# Patient Record
Sex: Female | Born: 1980 | Race: Black or African American | Hispanic: No | Marital: Single | Smoking: Former smoker
Health system: Southern US, Community
[De-identification: ages and names within clinical notes are randomized; demographics above are authoritative.]

## PROBLEM LIST (undated history)

## (undated) DIAGNOSIS — B009 Herpesviral infection, unspecified: Secondary | ICD-10-CM

## (undated) DIAGNOSIS — E119 Type 2 diabetes mellitus without complications: Secondary | ICD-10-CM

## (undated) HISTORY — PX: ABDOMINAL HYSTERECTOMY: SHX81

## (undated) HISTORY — DX: Herpesviral infection, unspecified: B00.9

## (undated) HISTORY — PX: TERATOMA EXCISION: SHX2491

---

## 2008-10-24 ENCOUNTER — Emergency Department (HOSPITAL_COMMUNITY): Admission: EM | Admit: 2008-10-24 | Discharge: 2008-10-24 | Payer: Self-pay | Admitting: Emergency Medicine

## 2012-04-16 ENCOUNTER — Ambulatory Visit: Payer: BC Managed Care – PPO

## 2012-04-16 ENCOUNTER — Emergency Department (HOSPITAL_COMMUNITY): Payer: BC Managed Care – PPO

## 2012-04-16 ENCOUNTER — Emergency Department (HOSPITAL_COMMUNITY)
Admission: EM | Admit: 2012-04-16 | Discharge: 2012-04-16 | Disposition: A | Discharge status: Home / Self Care | Payer: BC Managed Care – PPO | Attending: Emergency Medicine | Admitting: Emergency Medicine

## 2012-04-16 ENCOUNTER — Encounter (HOSPITAL_COMMUNITY): Payer: Self-pay

## 2012-04-16 DIAGNOSIS — J189 Pneumonia, unspecified organism: Secondary | ICD-10-CM | POA: Insufficient documentation

## 2012-04-16 DIAGNOSIS — F17200 Nicotine dependence, unspecified, uncomplicated: Secondary | ICD-10-CM | POA: Insufficient documentation

## 2012-04-16 MED ORDER — ACETAMINOPHEN 325 MG PO TABS
650.0000 mg | ORAL_TABLET | Freq: Once | ORAL | Status: AC
  Administered 2012-04-16: 650 mg via ORAL
  Filled 2012-04-16: qty 2

## 2012-04-16 MED ORDER — FENTANYL CITRATE 0.05 MG/ML IJ SOLN
50.0000 ug | Freq: Once | INTRAMUSCULAR | Status: AC
  Administered 2012-04-16: 50 ug via INTRAVENOUS
  Filled 2012-04-16: qty 2

## 2012-04-16 MED ORDER — OXYCODONE-ACETAMINOPHEN 5-325 MG PO TABS
1.0000 | ORAL_TABLET | Freq: Once | ORAL | Status: DC
  Filled 2012-04-16: qty 1

## 2012-04-16 MED ORDER — SODIUM CHLORIDE 0.9 % IV BOLUS (SEPSIS)
1000.0000 mL | Freq: Once | INTRAVENOUS | Status: AC
  Administered 2012-04-16: 1000 mL via INTRAVENOUS

## 2012-04-16 MED ORDER — ALBUTEROL SULFATE (5 MG/ML) 0.5% IN NEBU
5.0000 mg | INHALATION_SOLUTION | Freq: Once | RESPIRATORY_TRACT | Status: AC
  Administered 2012-04-16: 5 mg via RESPIRATORY_TRACT
  Filled 2012-04-16: qty 1

## 2012-04-16 MED ORDER — HYDROCODONE-ACETAMINOPHEN 5-500 MG PO TABS: 1.0000 | ORAL_TABLET | Freq: Four times a day (QID) | ORAL | Status: AC | PRN

## 2012-04-16 MED ORDER — AZITHROMYCIN 250 MG PO TABS
1000.0000 mg | ORAL_TABLET | Freq: Once | ORAL | Status: AC
  Administered 2012-04-16: 1000 mg via ORAL
  Filled 2012-04-16: qty 4

## 2012-04-16 MED ORDER — AZITHROMYCIN 250 MG PO TABS: ORAL_TABLET | ORAL | Status: AC

## 2012-04-16 MED ORDER — ALBUTEROL SULFATE HFA 108 (90 BASE) MCG/ACT IN AERS: INHALATION_SPRAY | RESPIRATORY_TRACT | Status: DC

## 2012-04-16 MED ORDER — ONDANSETRON HCL 4 MG/2ML IJ SOLN
4.0000 mg | Freq: Once | INTRAMUSCULAR | Status: AC
  Administered 2012-04-16: 4 mg via INTRAVENOUS
  Filled 2012-04-16: qty 2

## 2012-04-16 NOTE — ED Notes (Signed)
Pt stated no relief from pain.

## 2012-04-16 NOTE — ED Notes (Signed)
gen body aches, joint pain, fevers, cough x 2 days.  States she can't get a good cough b/c it hurts her chest to cough.

## 2012-04-16 NOTE — Discharge Instructions (Signed)
Take antibiotic as directed for pneumonia. Use hydrocodone-acetaminophen as needed for pain but do not drive or operate machinery with use. Alternate with ibuprofen for additional relief. Use albuterol inhaler as needed for cough. Follow up with a primary care provider as needed in the next 3-5 days for recheck of ongoing symptoms but return to ER at any time for changing or worsening of symptoms.   Pneumonia, Adult Pneumonia is an infection of the lungs.  CAUSES Pneumonia may be caused by bacteria or a virus. Usually, these infections are caused by breathing infectious particles into the lungs (respiratory tract). SYMPTOMS   Cough.   Fever.   Chest pain.   Increased rate of breathing.   Wheezing.   Mucus production.  DIAGNOSIS  If you have the common symptoms of pneumonia, your caregiver will typically confirm the diagnosis with a chest X-ray. The X-ray will show an abnormality in the lung (pulmonary infiltrate) if you have pneumonia. Other tests of your blood, urine, or sputum may be done to find the specific cause of your pneumonia. Your caregiver may also do tests (blood gases or pulse oximetry) to see how well your lungs are working. TREATMENT  Some forms of pneumonia may be spread to other people when you cough or sneeze. You may be asked to wear a mask before and during your exam. Pneumonia that is caused by bacteria is treated with antibiotic medicine. Pneumonia that is caused by the influenza virus may be treated with an antiviral medicine. Most other viral infections must run their course. These infections will not respond to antibiotics.  PREVENTION A pneumococcal shot (vaccine) is available to prevent a common bacterial cause of pneumonia. This is usually suggested for:  People over 53 years old.   Patients on chemotherapy.   People with chronic lung problems, such as bronchitis or emphysema.   People with immune system problems.  If you are over 65 or have a high risk  condition, you may receive the pneumococcal vaccine if you have not received it before. In some countries, a routine influenza vaccine is also recommended. This vaccine can help prevent some cases of pneumonia.You may be offered the influenza vaccine as part of your care. If you smoke, it is time to quit. You may receive instructions on how to stop smoking. Your caregiver can provide medicines and counseling to help you quit. HOME CARE INSTRUCTIONS   Cough suppressants may be used if you are losing too much rest. However, coughing protects you by clearing your lungs. You should avoid using cough suppressants if you can.   Your caregiver may have prescribed medicine if he or she thinks your pneumonia is caused by a bacteria or influenza. Finish your medicine even if you start to feel better.   Your caregiver may also prescribe an expectorant. This loosens the mucus to be coughed up.   Only take over-the-counter or prescription medicines for pain, discomfort, or fever as directed by your caregiver.   Do not smoke. Smoking is a common cause of bronchitis and can contribute to pneumonia. If you are a smoker and continue to smoke, your cough may last several weeks after your pneumonia has cleared.   A cold steam vaporizer or humidifier in your room or home may help loosen mucus.   Coughing is often worse at night. Sleeping in a semi-upright position in a recliner or using a couple pillows under your head will help with this.   Get rest as you feel it is needed.  Your body will usually let you know when you need to rest.  SEEK IMMEDIATE MEDICAL CARE IF:   Your illness becomes worse. This is especially true if you are elderly or weakened from any other disease.   You cannot control your cough with suppressants and are losing sleep.   You begin coughing up blood.   You develop pain which is getting worse or is uncontrolled with medicines.   You have a fever.   Any of the symptoms which  initially brought you in for treatment are getting worse rather than better.   You develop shortness of breath or chest pain.  MAKE SURE YOU:   Understand these instructions.   Will watch your condition.   Will get help right away if you are not doing well or get worse.  Document Released: 10/09/2005 Document Revised: 09/28/2011 Document Reviewed: 12/29/2010 [facility] Patient Information 2012 [place], [place].

## 2012-04-16 NOTE — ED Provider Notes (Signed)
History     CSN: [id number]  Arrival date & time 04/16/12  1327   First MD Initiated Contact with Patient 04/16/12 1513      Chief Complaint  Patient presents with  . Generalized Body Aches  . Fever  . Cough    (Consider location/radiation/quality/duration/timing/severity/associated sxs/prior treatment) HPI  Patient states she has no known medical problems and takes no medicines on regular basis presents to emergency department complaining of a 2 day history of general body aches, joint aches, fevers, and unproductive cough. Patient states that when she coughs she feels tightness and pain in her chest but states "I can't seem to cough anything up." Patient states that she has been taking ibuprofen without relief of symptoms however temporary improvement in fevers. She denies hemoptysis, abdominal pain, nausea, vomiting, diarrhea, dysuria, hematuria, or blood in her stool. Patient states she has no allergies to medications. Patient states she's followed by her primary care physician for general health care management, Dr. Blondell Reveal.   History reviewed. No pertinent past medical history.  Past Surgical History  Procedure Date  . Teratoma excision   . Cesarean section     x3  . Abdominal hysterectomy     History reviewed. No pertinent family history.  History  Substance Use Topics  . Smoking status: Current Everyday Smoker    Types: Cigarettes  . Smokeless tobacco: Not on file   Comment: took chantix so smokes 1-2 cigs/day  . Alcohol Use: Yes     occasionally    OB History    Grav Para Term Preterm Abortions TAB SAB Ect Mult Living                  Review of Systems  All other systems reviewed and are negative.    Allergies  Review of patient's allergies indicates not on file.  Home Medications  No current outpatient prescriptions on file.  BP 104/58  Pulse 104  Temp 98.1 F (36.7 C) (Oral)  Resp 18  Ht 5\' 8"  (1.727 m)  Wt 250 lb (113.399 kg)  BMI 38.01  kg/m2  SpO2 100%  Physical Exam  Nursing note and vitals reviewed. Constitutional: She is oriented to person, place, and time. She appears well-developed and well-nourished. No distress.  HENT:  Head: Normocephalic and atraumatic.  Eyes: Conjunctivae are normal.  Neck: Normal range of motion. Neck supple.  Cardiovascular: Regular rhythm, normal heart sounds and intact distal pulses.  Tachycardia present.  Exam reveals no gallop and no friction rub.   No murmur heard. Pulmonary/Chest: Effort normal and breath sounds normal. No respiratory distress. She has no wheezes. She has no rales. She exhibits no tenderness.  Abdominal: Soft. Bowel sounds are normal. She exhibits no distension and no mass. There is no tenderness. There is no rebound and no guarding.  Musculoskeletal: Normal range of motion. She exhibits no edema and no tenderness.  Neurological: She is alert and oriented to person, place, and time.  Skin: Skin is warm and dry. No rash noted. She is not diaphoretic. No erythema.  Psychiatric: She has a normal mood and affect.    ED Course  Procedures (including critical care time)  Labs Reviewed - No data to display Dg Chest 2 View  04/16/2012  *RADIOLOGY REPORT*  Clinical Data: Left-sided chest pain and body aches.  Fever and cough.  CHEST - 2 VIEW  Comparison: None.  Findings: The heart size is normal.  Left lingular airspace disease is concerning for pneumonia.  The  right lung is clear.  The visualized soft tissues and bony thorax are unremarkable.  IMPRESSION:  1.  Lingular pneumonia.  Original Report Authenticated By: Jamesetta Orleans. MATTERN, M.D.     1. CAP (community acquired pneumonia)       MDM  Patient with community-acquired pneumonia however she has no comorbidities and is appropriate for outpatient management of her CAP given that she is in no stress with improving vital signs improving symptoms throughout ER stay. I spoke at length with patient about changing or  worsening symptoms that should prompt immediate return to emergency department versus following up with her primary care physician. Patient voices her understanding and is agreeable plan to        Drucie Opitz, [place] 04/16/12 1933

## 2012-04-17 NOTE — ED Provider Notes (Signed)
Medical screening examination/treatment/procedure(s) were performed by non-physician practitioner and as supervising physician I was immediately available for consultation/collaboration.  Doug Sou, MD 04/17/12 [phone]

## 2012-12-12 ENCOUNTER — Encounter (HOSPITAL_BASED_OUTPATIENT_CLINIC_OR_DEPARTMENT_OTHER): Payer: Self-pay | Admitting: Emergency Medicine

## 2012-12-12 ENCOUNTER — Emergency Department (HOSPITAL_BASED_OUTPATIENT_CLINIC_OR_DEPARTMENT_OTHER)
Admission: EM | Admit: 2012-12-12 | Discharge: 2012-12-12 | Disposition: A | Discharge status: Home / Self Care | Payer: Medicaid Other | Attending: Emergency Medicine | Admitting: Emergency Medicine

## 2012-12-12 DIAGNOSIS — K089 Disorder of teeth and supporting structures, unspecified: Secondary | ICD-10-CM | POA: Insufficient documentation

## 2012-12-12 DIAGNOSIS — J3489 Other specified disorders of nose and nasal sinuses: Secondary | ICD-10-CM | POA: Insufficient documentation

## 2012-12-12 DIAGNOSIS — F17200 Nicotine dependence, unspecified, uncomplicated: Secondary | ICD-10-CM | POA: Insufficient documentation

## 2012-12-12 DIAGNOSIS — K0889 Other specified disorders of teeth and supporting structures: Secondary | ICD-10-CM

## 2012-12-12 DIAGNOSIS — R220 Localized swelling, mass and lump, head: Secondary | ICD-10-CM | POA: Insufficient documentation

## 2012-12-12 MED ORDER — PENICILLIN V POTASSIUM 250 MG PO TABS: 250.0000 mg | ORAL_TABLET | Freq: Four times a day (QID) | ORAL | Status: AC

## 2012-12-12 MED ORDER — OXYCODONE-ACETAMINOPHEN 5-325 MG PO TABS: 2.0000 | ORAL_TABLET | ORAL | Status: DC | PRN

## 2012-12-12 NOTE — ED Provider Notes (Signed)
History  This chart was scribed for Loren Racer, MD by Bennett Scrape, ED Scribe. This patient was seen in room MH05/MH05 and the patient's care was started at 6:32 PM.  CSN: [id number]  Arrival date & time 12/12/12  4098   First MD Initiated Contact with Patient 12/12/12 1832      Chief Complaint  Patient presents with  . Dental Pain    Patient is a 32 y.o. female presenting with tooth pain. The history is provided by the patient. No language interpreter was used.  Dental PainPrimary symptoms do not include fever. The symptoms began 2 days ago. The symptoms are worsening. The symptoms are recurrent. The symptoms occur constantly.  Additional symptoms include: gum swelling and gum tenderness. Additional symptoms do not include: facial swelling, trouble swallowing and ear pain. Medical issues do not include: alcohol problem.    Julie Coffey is a 32 y.o. female who presents to the Emergency Department complaining of 2 days of left upper dental pain with associated left sinus pressure. She reports that she has been following up with a dentist and has a root canal scheduled on March 6th, 2014. She states that she was taking ibuprofen and clove oil with moderate improvement. She denies trouble swallowing, fevers, chills and facial swelling as associated symptoms. She does not have a h/o chronic medical conditions and is a current everyday smoker but denies alcohol use.   History reviewed. No pertinent past medical history.  Past Surgical History  Procedure Laterality Date  . Teratoma excision    . Cesarean section      x3  . Abdominal hysterectomy      No family history on file.  History  Substance Use Topics  . Smoking status: Current Every Day Smoker    Types: Cigarettes  . Smokeless tobacco: Not on file     Comment: took chantix so smokes 1-2 cigs/day  . Alcohol Use: Yes     Comment: occasionally    No OB history provided.  Review of Systems  Constitutional:  Negative for fever and chills.  HENT: Positive for dental problem and sinus pressure. Negative for ear pain, facial swelling and trouble swallowing.   All other systems reviewed and are negative.    Allergies  Review of patient's allergies indicates no known allergies.  Home Medications   Current Outpatient Rx  Name  Route  Sig  Dispense  Refill  . albuterol (PROVENTIL HFA;VENTOLIN HFA) 108 (90 BASE) MCG/ACT inhaler      Inhale 1-2 puffs into the lungs every 6 (six) hours as needed for coughing.   1 Inhaler   0     Triage Vitals: BP 140/88  Pulse 84  Temp(Src) 98.5 F (36.9 C) (Oral)  Resp 16  Ht 5\' 8"  (1.727 m)  Wt 235 lb (106.595 kg)  BMI 35.74 kg/m2  SpO2 99%  Physical Exam  Nursing note and vitals reviewed. Constitutional: She is oriented to person, place, and time. She appears well-developed and well-nourished. No distress.  HENT:  Head: Normocephalic and atraumatic.  Left lateral incisor has a large cary, no obvious gingival swelling, poor dentition in general  Eyes: EOM are normal.  Neck: Neck supple. No tracheal deviation present.  Cardiovascular: Normal rate.   Pulmonary/Chest: Effort normal. No respiratory distress.  Musculoskeletal: Normal range of motion.  Lymphadenopathy:    She has no cervical adenopathy.  Neurological: She is alert and oriented to person, place, and time.  Skin: Skin is warm and dry.  Psychiatric:  She has a normal mood and affect. Her behavior is normal.    ED Course  Procedures (including critical care time)  DIAGNOSTIC STUDIES: Oxygen Saturation is 99% on room air, normal by my interpretation.    COORDINATION OF CARE: 6:40 PM- Advised pt that i cannot give her narcotics in the ED before discharge due to her driving. Discussed discharge plan which includes antibiotics and referral to a dentist with pt and pt agreed to plan.    Labs Reviewed - No data to display No results found.   1. Pain, dental       MDM  I  personally performed the services described in this documentation, which was scribed in my presence. The recorded information has been reviewed and is accurate.    Loren Racer, MD 12/12/12 2259

## 2012-12-12 NOTE — ED Notes (Signed)
C/o toothache and sinus pressure since 12/10/12.  Has taken IBU and Aleve without relief.  Has also tried Clove Oil with mild relief.

## 2014-01-09 ENCOUNTER — Emergency Department (HOSPITAL_BASED_OUTPATIENT_CLINIC_OR_DEPARTMENT_OTHER)
Admission: EM | Admit: 2014-01-09 | Discharge: 2014-01-09 | Disposition: A | Discharge status: Home / Self Care | Payer: BC Managed Care – PPO | Attending: Emergency Medicine | Admitting: Emergency Medicine

## 2014-01-09 ENCOUNTER — Encounter (HOSPITAL_BASED_OUTPATIENT_CLINIC_OR_DEPARTMENT_OTHER): Payer: Self-pay | Admitting: Emergency Medicine

## 2014-01-09 DIAGNOSIS — K0889 Other specified disorders of teeth and supporting structures: Secondary | ICD-10-CM

## 2014-01-09 DIAGNOSIS — Z792 Long term (current) use of antibiotics: Secondary | ICD-10-CM | POA: Insufficient documentation

## 2014-01-09 DIAGNOSIS — K089 Disorder of teeth and supporting structures, unspecified: Secondary | ICD-10-CM | POA: Insufficient documentation

## 2014-01-09 DIAGNOSIS — F17200 Nicotine dependence, unspecified, uncomplicated: Secondary | ICD-10-CM | POA: Insufficient documentation

## 2014-01-09 MED ORDER — OXYCODONE-ACETAMINOPHEN 5-325 MG PO TABS: 1.0000 | ORAL_TABLET | ORAL | Status: DC | PRN

## 2014-01-09 MED ORDER — PENICILLIN V POTASSIUM 250 MG PO TABS: 250.0000 mg | ORAL_TABLET | Freq: Four times a day (QID) | ORAL | Status: AC

## 2014-01-09 NOTE — ED Notes (Signed)
Dental pain x 1 week worsening last night.

## 2014-01-09 NOTE — ED Notes (Signed)
PA at bedside.

## 2014-01-09 NOTE — Discharge Instructions (Signed)
Dental Pain °Toothache is pain in or around a tooth. It may get worse with chewing or with cold or heat.  °HOME CARE °· Your dentist may use a numbing medicine during treatment. If so, you may need to avoid eating until the medicine wears off. Ask your dentist about this. °· Only take medicine as told by your dentist or doctor. °· Avoid chewing food near the painful tooth until after all treatment is done. Ask your dentist about this. °GET HELP RIGHT AWAY IF:  °· The problem gets worse or new problems appear. °· You have a fever. °· There is redness and puffiness (swelling) of the face, jaw, or neck. °· You cannot open your mouth. °· There is pain in the jaw. °· There is very bad pain that is not helped by medicine. °MAKE SURE YOU:  °· Understand these instructions. °· Will watch your condition. °· Will get help right away if you are not doing well or get worse. °Document Released: 03/27/2008 Document Revised: 01/01/2012 Document Reviewed: 03/27/2008 °[employer]® Patient Information ©2014 [employer]. ° °

## 2014-01-09 NOTE — ED Provider Notes (Signed)
CSN: [id number]     Arrival date & time 01/09/14  1840 History   First MD Initiated Contact with Patient 01/09/14 1912     Chief Complaint  Patient presents with  . Dental Pain     (Consider location/radiation/quality/duration/timing/severity/associated sxs/prior Treatment) HPI Comments: Pt c/o left lower dentition that started a week ago and worsened last night. Pt state that she has an appointment with the dentist in the next week. Pt denies fever, swelling or problems breathing  The history is provided by the patient. No language interpreter was used.    History reviewed. No pertinent past medical history. Past Surgical History  Procedure Laterality Date  . Teratoma excision    . Cesarean section      x3  . Abdominal hysterectomy     No family history on file. History  Substance Use Topics  . Smoking status: Current Every Day Smoker -- 0.50 packs/day    Types: Cigarettes  . Smokeless tobacco: Not on file     Comment: took chantix so smokes 1-2 cigs/day  . Alcohol Use: Yes     Comment: occasionally   OB History   Grav Para Term Preterm Abortions TAB SAB Ect Mult Living                 Review of Systems  Constitutional: Negative.   Respiratory: Negative.   Cardiovascular: Negative.       Allergies  Review of patient's allergies indicates no known allergies.  Home Medications   Current Outpatient Rx  Name  Route  Sig  Dispense  Refill  . albuterol (PROVENTIL HFA;VENTOLIN HFA) 108 (90 BASE) MCG/ACT inhaler      Inhale 1-2 puffs into the lungs every 6 (six) hours as needed for coughing.   1 Inhaler   0   . oxyCODONE-acetaminophen (PERCOCET) 5-325 MG per tablet   Oral   Take 2 tablets by mouth every 4 (four) hours as needed for pain.   10 tablet   0   . oxyCODONE-acetaminophen (PERCOCET/ROXICET) 5-325 MG per tablet   Oral   Take 1-2 tablets by mouth every 4 (four) hours as needed for severe pain.   10 tablet   0   . penicillin v potassium (VEETID)  250 MG tablet   Oral   Take 1 tablet (250 mg total) by mouth 4 (four) times daily.   40 tablet   0    BP 148/88  Pulse 95  Temp(Src) 98 F (36.7 C) (Oral)  Resp 20  Ht 5\' 8"  (1.727 m)  Wt 235 lb (106.595 kg)  BMI 35.74 kg/m2  SpO2 100% Physical Exam  Nursing note and vitals reviewed. Constitutional: She appears well-developed and well-nourished.  HENT:  Head: Normocephalic and atraumatic.  Right Ear: External ear normal.  Left Ear: External ear normal.  Mouth/Throat:    Eyes: Conjunctivae and EOM are normal.  Cardiovascular: Normal rate and regular rhythm.   Pulmonary/Chest: Effort normal and breath sounds normal.    ED Course  Procedures (including critical care time) Labs Review Labs Reviewed - No data to display Imaging Review No results found.   EKG Interpretation None      MDM   Final diagnoses:  Toothache    Pt to follow up with dentist next week;treated with oxycodone and pcn    Teressa LowerVrinda Loretha Ure, NP 01/09/14 1944

## 2014-01-09 NOTE — ED Provider Notes (Signed)
Medical screening examination/treatment/procedure(s) were performed by non-physician practitioner and as supervising physician I was immediately available for consultation/collaboration.   EKG Interpretation None        Raylie Maddison S Amani Nodarse, MD 01/09/14 2350 

## 2014-06-09 ENCOUNTER — Emergency Department (HOSPITAL_BASED_OUTPATIENT_CLINIC_OR_DEPARTMENT_OTHER)
Admission: EM | Admit: 2014-06-09 | Discharge: 2014-06-10 | Disposition: A | Discharge status: Home / Self Care | Payer: Managed Care, Other (non HMO) | Attending: Emergency Medicine | Admitting: Emergency Medicine

## 2014-06-09 ENCOUNTER — Encounter (HOSPITAL_BASED_OUTPATIENT_CLINIC_OR_DEPARTMENT_OTHER): Payer: Self-pay | Admitting: Emergency Medicine

## 2014-06-09 DIAGNOSIS — K089 Disorder of teeth and supporting structures, unspecified: Secondary | ICD-10-CM | POA: Insufficient documentation

## 2014-06-09 DIAGNOSIS — R220 Localized swelling, mass and lump, head: Secondary | ICD-10-CM | POA: Diagnosis not present

## 2014-06-09 DIAGNOSIS — K0889 Other specified disorders of teeth and supporting structures: Secondary | ICD-10-CM

## 2014-06-09 DIAGNOSIS — R221 Localized swelling, mass and lump, neck: Secondary | ICD-10-CM | POA: Diagnosis not present

## 2014-06-09 DIAGNOSIS — F17200 Nicotine dependence, unspecified, uncomplicated: Secondary | ICD-10-CM | POA: Diagnosis not present

## 2014-06-09 MED ORDER — HYDROMORPHONE HCL PF 1 MG/ML IJ SOLN: 1.0000 mg | Freq: Once | INTRAMUSCULAR | Status: DC

## 2014-06-09 NOTE — Discharge Instructions (Signed)

## 2014-06-09 NOTE — ED Provider Notes (Signed)
CSN: [id number]     Arrival date & time 06/09/14  1848 History  This chart was scribed for Julie Sheffield, MD by Milly Jakob, ED Scribe. The patient was seen in room MH03/MH03. Patient's care was started at 9:50 PM.    Chief Complaint  Patient presents with  . Facial Swelling   Patient is a 33 y.o. female presenting with tooth pain. The history is provided by the patient. No language interpreter was used.  Dental Pain Location:  Lower Quality:  Aching Severity:  Moderate Onset quality:  Gradual Timing:  Constant Progression:  Worsening Chronicity:  New Relieved by:  Nothing Worsened by:  Touching and pressure Associated symptoms: facial pain and facial swelling   Associated symptoms: no congestion, no drooling, no fever, no headaches and no neck pain    HPI Comments: Julie Coffey is a 33 y.o. female who presents to the Emergency Department complaining of left sided dental pain which began yesterday. She reports that she was seen for this yesterday at her dentist and prescribed ABX for dental decay. She reports that she is due for a root canal on this tooth. She reports that this afternoon her left sided face has become very swollen. She reports taking Ibuprofen with minimal relief. She denies fever or other medical problems.   History reviewed. No pertinent past medical history. Past Surgical History  Procedure Laterality Date  . Teratoma excision    . Cesarean section      x3  . Abdominal hysterectomy     No family history on file. History  Substance Use Topics  . Smoking status: Current Every Day Smoker -- 0.50 packs/day    Types: Cigarettes  . Smokeless tobacco: Not on file     Comment: took chantix so smokes 1-2 cigs/day  . Alcohol Use: Yes     Comment: occasionally   OB History   Grav Para Term Preterm Abortions TAB SAB Ect Mult Living                 Review of Systems  Constitutional: Negative for fever and fatigue.  HENT: Positive for dental problem  and facial swelling. Negative for congestion and drooling.   Eyes: Negative for pain.  Respiratory: Negative for cough and shortness of breath.   Cardiovascular: Negative for chest pain.  Gastrointestinal: Negative for nausea, vomiting, abdominal pain and diarrhea.  Genitourinary: Negative for dysuria and hematuria.  Musculoskeletal: Negative for neck pain.  Skin: Negative for color change.  Neurological: Negative for dizziness and headaches.  Hematological: Negative for adenopathy.  Psychiatric/Behavioral: Negative for behavioral problems.  All other systems reviewed and are negative.  Allergies  Review of patient's allergies indicates no known allergies.  Home Medications   Prior to Admission medications   Medication Sig Start Date End Date Taking? Authorizing Provider  albuterol (PROVENTIL HFA;VENTOLIN HFA) 108 (90 BASE) MCG/ACT inhaler Inhale 1-2 puffs into the lungs every 6 (six) hours as needed for coughing. 04/16/12   Bethany Hunt, PA-C  oxyCODONE-acetaminophen (PERCOCET) 5-325 MG per tablet Take 2 tablets by mouth every 4 (four) hours as needed for pain. 12/12/12   Loren Racer, MD  oxyCODONE-acetaminophen (PERCOCET/ROXICET) 5-325 MG per tablet Take 1-2 tablets by mouth every 4 (four) hours as needed for severe pain. 01/09/14   Teressa Lower, NP   Triage Vitals: BP 144/90  Pulse 63  Temp(Src) 97.8 F (36.6 C) (Oral)  Resp 20  Ht 5\' 8"  (1.727 m)  Wt 244 lb (110.678 kg)  BMI 37.11  kg/m2  SpO2 100% Physical Exam  Nursing note and vitals reviewed. Constitutional: She is oriented to person, place, and time. She appears well-developed and well-nourished. No distress.  HENT:  Head: Normocephalic and atraumatic.  Right Ear: Hearing normal.  Left Ear: Hearing normal.  Nose: Nose normal.  Mouth/Throat: Oropharynx is clear and moist and mucous membranes are normal.  Tenderness to palpation of the left lower 1st molar. Mild surrounding gingival inflammation. No obvious  abscess noted on inspection of the oral cavity. Normal appearing posterior oral pharynx. No trismus.  Mild swelling to the left mid mandible.   Eyes: Conjunctivae and EOM are normal. Pupils are equal, round, and reactive to light.  Neck: Normal range of motion. Neck supple.  Cardiovascular: Regular rhythm, S1 normal and S2 normal.  Exam reveals no gallop and no friction rub.   No murmur heard. Pulmonary/Chest: Effort normal and breath sounds normal. No respiratory distress. She exhibits no tenderness.  Abdominal: Soft. Normal appearance and bowel sounds are normal. There is no hepatosplenomegaly. There is no tenderness. There is no rebound, no guarding, no tenderness at McBurney's point and negative Murphy's sign. No hernia.  Musculoskeletal: Normal range of motion.  Neurological: She is alert and oriented to person, place, and time. She has normal strength. No cranial nerve deficit or sensory deficit. Coordination normal. GCS eye subscore is 4. GCS verbal subscore is 5. GCS motor subscore is 6.  Skin: Skin is warm, dry and intact. No rash noted. No cyanosis.  Psychiatric: She has a normal mood and affect. Her speech is normal and behavior is normal. Thought content normal.    ED Course  Procedures (including critical care time) DIAGNOSTIC STUDIES: Oxygen Saturation is 100% on room air, normal by my interpretation.    COORDINATION OF CARE: 10:00 PM-Discussed treatment plan which includes pain medication with pt at bedside and pt agreed to plan.   Labs Review Labs Reviewed - No data to display  Imaging Review No results found.   EKG Interpretation None      MDM   Final diagnoses:  Pain, dental  Facial swelling    11:54 AM 33 y.o. female who pw dental78 pain and facial swelling. Sx likely related to left lower molar infection. No obvious abscess on exam. Swelling localized to left mandible. Swelling is not submandibular. Pt has already had dental exam and is on pain meds/abx.  Would rec she continue these meds and f/u w/ her dentist.   11:55 AM:  I have discussed the diagnosis/risks/treatment options with the patient and believe the pt to be eligible for discharge home to follow-up with her dentist. We also discussed returning to the ED immediately if new or worsening sx occur. We discussed the sx which are most concerning (e.g., inc swelling, inability to open her mouth to fit two finger breadths in btw her teeth, fever, trouble handling secretions, worsening pain) that necessitate immediate return. Medications administered to the patient during their visit and any new prescriptions provided to the patient are listed below.  Medications given during this visit Medications - No data to display  Discharge Medication List as of 06/09/2014 10:18 PM         I personally performed the services described in this documentation, which was scribed in my presence. The recorded information has been reviewed and is accurate.     Julie SheffieldForrest Araya Roel, MD 06/10/14 [phone]

## 2014-06-09 NOTE — ED Notes (Signed)
rec'd call from registration stating that pt stated she was tired of waiting and walked out of dept in NAD. MD made aware.

## 2014-06-09 NOTE — ED Notes (Signed)
Pt says that she was at her dentist yesterday and was prescribed antibiotics and pain meds for a painful tooth. Pt says that she has had increasing pressure and swelling in lower face since this morning.

## 2014-06-10 NOTE — ED Notes (Signed)
Pt left dept prior to receiving pain meds or d/c papers.

## 2015-10-20 ENCOUNTER — Encounter (HOSPITAL_BASED_OUTPATIENT_CLINIC_OR_DEPARTMENT_OTHER): Payer: Self-pay | Admitting: *Deleted

## 2015-10-20 ENCOUNTER — Emergency Department (HOSPITAL_BASED_OUTPATIENT_CLINIC_OR_DEPARTMENT_OTHER)
Admission: EM | Admit: 2015-10-20 | Discharge: 2015-10-20 | Disposition: A | Discharge status: Home / Self Care | Payer: 59 | Attending: Emergency Medicine | Admitting: Emergency Medicine

## 2015-10-20 DIAGNOSIS — F17210 Nicotine dependence, cigarettes, uncomplicated: Secondary | ICD-10-CM | POA: Insufficient documentation

## 2015-10-20 DIAGNOSIS — J111 Influenza due to unidentified influenza virus with other respiratory manifestations: Secondary | ICD-10-CM | POA: Insufficient documentation

## 2015-10-20 DIAGNOSIS — R05 Cough: Secondary | ICD-10-CM | POA: Diagnosis present

## 2015-10-20 DIAGNOSIS — R69 Illness, unspecified: Secondary | ICD-10-CM

## 2015-10-20 MED ORDER — OSELTAMIVIR PHOSPHATE 75 MG PO CAPS: 75.0000 mg | ORAL_CAPSULE | Freq: Two times a day (BID) | ORAL | Status: DC

## 2015-10-20 MED ORDER — HYDROCOD POLST-CPM POLST ER 10-8 MG/5ML PO SUER: 5.0000 mL | Freq: Two times a day (BID) | ORAL | Status: DC | PRN

## 2015-10-20 MED ORDER — ACETAMINOPHEN 500 MG PO TABS
1000.0000 mg | ORAL_TABLET | Freq: Once | ORAL | Status: AC
  Administered 2015-10-20: 1000 mg via ORAL
  Filled 2015-10-20: qty 2

## 2015-10-20 MED ORDER — ALBUTEROL SULFATE HFA 108 (90 BASE) MCG/ACT IN AERS: 1.0000 | INHALATION_SPRAY | Freq: Four times a day (QID) | RESPIRATORY_TRACT | Status: DC | PRN

## 2015-10-20 MED ORDER — ACETAMINOPHEN 325 MG PO TABS: 650.0000 mg | ORAL_TABLET | Freq: Once | ORAL | Status: DC

## 2015-10-20 NOTE — ED Notes (Signed)
Pt amb to triage with quick steady gait in nad. Pt reports cough, congestion x yesterday. Also subjective temps.

## 2015-10-20 NOTE — ED Notes (Signed)
PA at bedside.

## 2015-10-20 NOTE — ED Notes (Addendum)
Pt given RX x 3 for tamiflu, albuterol, and tussionex at discharge and note for work

## 2015-10-20 NOTE — Discharge Instructions (Signed)
Read the information below.  Use the prescribed medication as directed.  Please discuss all new medications with your pharmacist.  You may return to the Emergency Department at any time for worsening condition or any new symptoms that concern you.    If you develop high fevers that do not resolve with tylenol or ibuprofen, you have difficulty swallowing or breathing, or you are unable to tolerate fluids by mouth, return to the ER for a recheck.      Influenza, Adult Influenza ("the flu") is a viral infection of the respiratory tract. It occurs more often in winter months because people spend more time in close contact with one another. Influenza can make you feel very sick. Influenza easily spreads from person to person (contagious). CAUSES  Influenza is caused by a virus that infects the respiratory tract. You can catch the virus by breathing in droplets from an infected person's cough or sneeze. You can also catch the virus by touching something that was recently contaminated with the virus and then touching your mouth, nose, or eyes. RISKS AND COMPLICATIONS You may be at risk for a more severe case of influenza if you smoke cigarettes, have diabetes, have chronic heart disease (such as heart failure) or lung disease (such as asthma), or if you have a weakened immune system. Elderly people and pregnant women are also at risk for more serious infections. The most common problem of influenza is a lung infection (pneumonia). Sometimes, this problem can require emergency medical care and may be life threatening. SIGNS AND SYMPTOMS  Symptoms typically last 4 to 10 days and may include:  Fever.  Chills.  Headache, body aches, and muscle aches.  Sore throat.  Chest discomfort and cough.  Poor appetite.  Weakness or feeling tired.  Dizziness.  Nausea or vomiting. DIAGNOSIS  Diagnosis of influenza is often made based on your history and a physical exam. A nose or throat swab test can be done  to confirm the diagnosis. TREATMENT  In mild cases, influenza goes away on its own. Treatment is directed at relieving symptoms. For more severe cases, your health care provider may prescribe antiviral medicines to shorten the sickness. Antibiotic medicines are not effective because the infection is caused by a virus, not by bacteria. HOME CARE INSTRUCTIONS  Take medicines only as directed by your health care provider.  Use a cool mist humidifier to make breathing easier.  Get plenty of rest until your temperature returns to normal. This usually takes 3 to 4 days.  Drink enough fluid to keep your urine clear or pale yellow.  Cover yourmouth and nosewhen coughing or sneezing,and wash your handswellto prevent thevirusfrom spreading.  Stay homefromwork orschool untilthe fever is gonefor at least 761full day. PREVENTION  An annual influenza vaccination (flu shot) is the best way to avoid getting influenza. An annual flu shot is now routinely recommended for all adults in the [place] SEEK MEDICAL CARE IF:  You experiencechest pain, yourcough worsens,or you producemore mucus.  Youhave nausea,vomiting, ordiarrhea.  Your fever returns or gets worse. SEEK IMMEDIATE MEDICAL CARE IF:  You havetrouble breathing, you become short of breath,or your skin ornails becomebluish.  You have severe painor stiffnessin the neck.  You develop a sudden headache, or pain in the face or ear.  You have nausea or vomiting that you cannot control. MAKE SURE YOU:   Understand these instructions.  Will watch your condition.  Will get help right away if you are not doing well or get worse.  This information is not intended to replace advice given to you by your health care provider. Make sure you discuss any questions you have with your health care provider.   Document Released: 10/06/2000 Document Revised: 10/30/2014 Document Reviewed: 01/08/2012 Elsevier Interactive Patient  Education 2016 [employer]  Cough, Adult Coughing is a reflex that clears your throat and your airways. Coughing helps to heal and protect your lungs. It is normal to cough occasionally, but a cough that happens with other symptoms or lasts a long time may be a sign of a condition that needs treatment. A cough may last only 2-3 weeks (acute), or it may last longer than 8 weeks (chronic). CAUSES Coughing is commonly caused by:  Breathing in substances that irritate your lungs.  A viral or bacterial respiratory infection.  Allergies.  Asthma.  Postnasal drip.  Smoking.  Acid backing up from the stomach into the esophagus (gastroesophageal reflux).  Certain medicines.  Chronic lung problems, including COPD (or rarely, lung cancer).  Other medical conditions such as heart failure. HOME CARE INSTRUCTIONS  Pay attention to any changes in your symptoms. Take these actions to help with your discomfort:  Take medicines only as told by your health care provider.  If you were prescribed an antibiotic medicine, take it as told by your health care provider. Do not stop taking the antibiotic even if you start to feel better.  Talk with your health care provider before you take a cough suppressant medicine.  Drink enough fluid to keep your urine clear or pale yellow.  If the air is dry, use a cold steam vaporizer or humidifier in your bedroom or your home to help loosen secretions.  Avoid anything that causes you to cough at work or at home.  If your cough is worse at night, try sleeping in a semi-upright position.  Avoid cigarette smoke. If you smoke, quit smoking. If you need help quitting, ask your health care provider.  Avoid caffeine.  Avoid alcohol.  Rest as needed. SEEK MEDICAL CARE IF:   You have new symptoms.  You cough up pus.  Your cough does not get better after 2-3 weeks, or your cough gets worse.  You cannot control your cough with suppressant medicines and  you are losing sleep.  You develop pain that is getting worse or pain that is not controlled with pain medicines.  You have a fever.  You have unexplained weight loss.  You have night sweats. SEEK IMMEDIATE MEDICAL CARE IF:  You cough up blood.  You have difficulty breathing.  Your heartbeat is very fast.   This information is not intended to replace advice given to you by your health care provider. Make sure you discuss any questions you have with your health care provider.   Document Released: 04/07/2011 Document Revised: 06/30/2015 Document Reviewed: 12/16/2014 Elsevier Interactive Patient Education [employer]

## 2015-10-20 NOTE — ED Provider Notes (Signed)
CSN: [id number]     Arrival date & time 10/20/15  1610 History   First MD Initiated Contact with Patient 10/20/15 1755     Chief Complaint  Patient presents with  . Cough     (Consider location/radiation/quality/duration/timing/severity/associated sxs/prior Treatment) HPI   Pt presents with fevers, myalgias, arthralgias, cough that began yesterday.  Cough was dry and deep yesterday, productive of yellow sputum today.  Denies nasal symptoms, sore throat, SOB.  Has been taking ibuprofen, Braggs vinegar, tea, honey, detox baths without improvement.  Did not get the flu shot this year.  Sister was sick over the weekend and multiple people sick at work.    History reviewed. No pertinent past medical history. Past Surgical History  Procedure Laterality Date  . Teratoma excision    . Cesarean section      x3  . Abdominal hysterectomy     History reviewed. No pertinent family history. Social History  Substance Use Topics  . Smoking status: Current Every Day Smoker -- 0.50 packs/day    Types: Cigarettes  . Smokeless tobacco: None     Comment: took chantix so smokes 1-2 cigs/day  . Alcohol Use: Yes     Comment: occasionally   OB History    No data available     Review of Systems  All other systems reviewed and are negative.     Allergies  Review of patient's allergies indicates no known allergies.  Home Medications   Prior to Admission medications   Not on File   BP 123/90 mmHg  Pulse 103  Temp(Src) 99.2 F (37.3 C) (Oral)  Resp 18  Ht 5\' 8"  (1.727 m)  Wt 117.935 kg  BMI 39.54 kg/m2  SpO2 100% Physical Exam  Constitutional: She appears well-developed and well-nourished. No distress.  HENT:  Head: Normocephalic and atraumatic.  Mouth/Throat: Oropharynx is clear and moist. No oropharyngeal exudate.  Eyes: Conjunctivae are normal.  Neck: Normal range of motion. Neck supple.  Cardiovascular: Normal rate and regular rhythm.   Pulmonary/Chest: Effort normal and  breath sounds normal. No respiratory distress. She has no wheezes. She has no rales.  Neurological: She is alert.  Skin: Skin is warm. She is not diaphoretic.  Nursing note and vitals reviewed.   ED Course  Procedures (including critical care time) Labs Review Labs Reviewed - No data to display  Imaging Review No results found. I have personally reviewed and evaluated these images and lab results as part of my medical decision-making.   EKG Interpretation None      MDM   Final diagnoses:  Influenza-like illness    Febrile nontoxic patient with 1 full day of cough, myalgias/arthralgias, fever.  Multiple sick contacts.  Lungs CTAB, no SOB, no hypoxia.  Low risk for PE.  Doubt pneumonia.  Likely influenza.  Discussed use of tamiflu, pt would like to try it.   D/C home with tamiflu, tussionex, albuterol.   Discussed result, findings, treatment, and follow up  with patient.  Pt given return precautions.  Pt verbalizes understanding and agrees with plan.        Trixie Dredgemily Sharron Petruska, PA-C 10/20/15 Rickey [id number]  Tilden FossaElizabeth Rees, MD 10/21/15 0111

## 2016-12-19 ENCOUNTER — Emergency Department (HOSPITAL_BASED_OUTPATIENT_CLINIC_OR_DEPARTMENT_OTHER): Payer: 59

## 2016-12-19 ENCOUNTER — Encounter (HOSPITAL_BASED_OUTPATIENT_CLINIC_OR_DEPARTMENT_OTHER): Payer: Self-pay | Admitting: *Deleted

## 2016-12-19 ENCOUNTER — Emergency Department (HOSPITAL_BASED_OUTPATIENT_CLINIC_OR_DEPARTMENT_OTHER)
Admission: EM | Admit: 2016-12-19 | Discharge: 2016-12-19 | Disposition: A | Discharge status: Home / Self Care | Payer: 59 | Attending: Emergency Medicine | Admitting: Emergency Medicine

## 2016-12-19 DIAGNOSIS — X509XXA Other and unspecified overexertion or strenuous movements or postures, initial encounter: Secondary | ICD-10-CM | POA: Diagnosis not present

## 2016-12-19 DIAGNOSIS — F17210 Nicotine dependence, cigarettes, uncomplicated: Secondary | ICD-10-CM | POA: Diagnosis not present

## 2016-12-19 DIAGNOSIS — Y939 Activity, unspecified: Secondary | ICD-10-CM | POA: Diagnosis not present

## 2016-12-19 DIAGNOSIS — Y929 Unspecified place or not applicable: Secondary | ICD-10-CM | POA: Insufficient documentation

## 2016-12-19 DIAGNOSIS — S43401A Unspecified sprain of right shoulder joint, initial encounter: Secondary | ICD-10-CM | POA: Diagnosis not present

## 2016-12-19 DIAGNOSIS — Y999 Unspecified external cause status: Secondary | ICD-10-CM | POA: Diagnosis not present

## 2016-12-19 DIAGNOSIS — S4991XA Unspecified injury of right shoulder and upper arm, initial encounter: Secondary | ICD-10-CM | POA: Diagnosis present

## 2016-12-19 NOTE — ED Triage Notes (Signed)
Pt c/o right shoulder injury x 1 hr ago  

## 2016-12-19 NOTE — ED Provider Notes (Signed)
[facility]   CSN: [id number] Arrival date & time: 12/19/16  1355     History   Chief Complaint Chief Complaint  Patient presents with  . Shoulder Injury    HPI Julie Coffey is a 36 y.o. female.  HPI  36 year old female presents with a right shoulder injury. Occurred about one hour prior to arrival. She has a 200 pound dog and they were trying a new lesion on the dog. The dog pulled the patient forward unexpectedly injure the patient's right arm forward. She's been having anterior shoulder pain since. She has full range of motion but it is painful. She did not fall. There is no weakness or numbness. She has not tried anything for the pain.  History reviewed. No pertinent past medical history.  There are no active problems to display for this patient.   Past Surgical History:  Procedure Laterality Date  . ABDOMINAL HYSTERECTOMY    . CESAREAN SECTION     x3  . TERATOMA EXCISION      OB History    No data available       Home Medications    Prior to Admission medications   Medication Sig Start Date End Date Taking? Authorizing Provider  albuterol (PROVENTIL HFA;VENTOLIN HFA) 108 (90 Base) MCG/ACT inhaler Inhale 1-2 puffs into the lungs every 6 (six) hours as needed for wheezing or shortness of breath. 10/20/15   Trixie DredgeEmily West, PA-C  chlorpheniramine-HYDROcodone (TUSSIONEX PENNKINETIC ER) 10-8 MG/5ML SUER Take 5 mLs by mouth every 12 (twelve) hours as needed for cough (and pain). 10/20/15   Trixie DredgeEmily West, PA-C    Family History No family history on file.  Social History Social History  Substance Use Topics  . Smoking status: Current Every Day Smoker    Packs/day: 0.50    Types: Cigarettes  . Smokeless tobacco: Not on file     Comment: took chantix so smokes 1-2 cigs/day  . Alcohol use Yes     Comment: occasionally     Allergies   Patient has no known allergies.   Review of Systems Review of Systems  Musculoskeletal: Positive  for arthralgias.  Neurological: Negative for weakness and numbness.  All other systems reviewed and are negative.    Physical Exam Updated Vital Signs BP 127/90   Pulse 117   Temp 98.3 F (36.8 C)   Resp 16   Ht 5\' 8"  (1.727 m)   Wt 240 lb (108.9 kg)   SpO2 100%   BMI 36.49 kg/m   Physical Exam  Constitutional: She is oriented to person, place, and time. She appears well-developed and well-nourished.  HENT:  Head: Normocephalic and atraumatic.  Right Ear: External ear normal.  Left Ear: External ear normal.  Nose: Nose normal.  Eyes: Right eye exhibits no discharge. Left eye exhibits no discharge.  Cardiovascular: Normal rate and regular rhythm.   Pulses:      Radial pulses are 2+ on the right side.  Pulmonary/Chest: Effort normal.  Musculoskeletal:       Right shoulder: She exhibits tenderness, bony tenderness and pain. She exhibits normal range of motion and no deformity.       Right upper arm: She exhibits no tenderness.       Arms: Normal but slow ROM of shoulder. No tenderness distal to shoulder. No swelling or deformity. Normal gross sensation and movement/strength in RUE.  Neurological: She is alert and oriented to person, place, and time.  Skin: Skin is warm and dry.  Nursing note and vitals reviewed.    ED Treatments / Results  Labs (all labs ordered are listed, but only abnormal results are displayed) Labs Reviewed - No data to display  EKG  EKG Interpretation None       Radiology Dg Shoulder Right  Result Date: 12/19/2016 CLINICAL DATA:  Pulling injury while walking dog. Right anterior shoulder pain. EXAM: RIGHT SHOULDER - 2+ VIEW COMPARISON:  None. FINDINGS: There is no evidence of fracture or dislocation. There is no evidence of arthropathy or other focal bone abnormality. Soft tissues are unremarkable. IMPRESSION: Negative. Electronically Signed   By: Signa Kell M.D.   On: 12/19/2016 14:25    Procedures Procedures (including critical care  time)  Medications Ordered in ED Medications - No data to display   Initial Impression / Assessment and Plan / ED Course  I have reviewed the triage vital signs and the nursing notes.  Pertinent labs & imaging results that were available during my care of the patient were reviewed by me and considered in my medical decision making (see chart for details).     Patient's presentation is consistent with a shoulder sprain. She was offered ibuprofen and ice but she declined. Advised to do these and exercises at home. Discussed possible frozen shoulder if she significant decreases movement. Follow-up with Dr. Pearletha Forge if not improving.  Final Clinical Impressions(s) / ED Diagnoses   Final diagnoses:  Sprain of right shoulder, unspecified shoulder sprain type, initial encounter    New Prescriptions Discharge Medication List as of 12/19/2016  3:03 PM       Pricilla Loveless, MD 12/19/16 1527

## 2018-06-22 ENCOUNTER — Other Ambulatory Visit: Payer: Self-pay

## 2018-06-22 ENCOUNTER — Emergency Department (HOSPITAL_COMMUNITY): Payer: Managed Care, Other (non HMO)

## 2018-06-22 ENCOUNTER — Encounter (HOSPITAL_COMMUNITY): Payer: Self-pay | Admitting: Emergency Medicine

## 2018-06-22 ENCOUNTER — Emergency Department (HOSPITAL_COMMUNITY)
Admission: EM | Admit: 2018-06-22 | Discharge: 2018-06-22 | Disposition: A | Discharge status: Home / Self Care | Payer: Managed Care, Other (non HMO) | Attending: Emergency Medicine | Admitting: Emergency Medicine

## 2018-06-22 DIAGNOSIS — R55 Syncope and collapse: Secondary | ICD-10-CM | POA: Insufficient documentation

## 2018-06-22 DIAGNOSIS — Z79899 Other long term (current) drug therapy: Secondary | ICD-10-CM | POA: Diagnosis not present

## 2018-06-22 DIAGNOSIS — F17210 Nicotine dependence, cigarettes, uncomplicated: Secondary | ICD-10-CM | POA: Diagnosis not present

## 2018-06-22 DIAGNOSIS — N61 Mastitis without abscess: Secondary | ICD-10-CM | POA: Diagnosis not present

## 2018-06-22 LAB — COMPREHENSIVE METABOLIC PANEL
ALBUMIN: 4.4 g/dL (ref 3.5–5.0)
ALK PHOS: 52 U/L (ref 38–126)
ALT: 22 U/L (ref 0–44)
AST: 16 U/L (ref 15–41)
Anion gap: 9 (ref 5–15)
BUN: 12 mg/dL (ref 6–20)
CALCIUM: 9.5 mg/dL (ref 8.9–10.3)
CO2: 27 mmol/L (ref 22–32)
Chloride: 105 mmol/L (ref 98–111)
Creatinine, Ser: 1.14 mg/dL — ABNORMAL HIGH (ref 0.44–1.00)
GFR calc non Af Amer: >60 mL/min (ref >60)
GLUCOSE: 95 mg/dL (ref 70–99)
Potassium: 4 mmol/L (ref 3.5–5.1)
SODIUM: 141 mmol/L (ref 135–145)
Total Bilirubin: 0.4 mg/dL (ref 0.3–1.2)
Total Protein: 7.3 g/dL (ref 6.5–8.1)

## 2018-06-22 LAB — URINALYSIS, ROUTINE W REFLEX MICROSCOPIC
BILIRUBIN URINE: NEGATIVE
Glucose, UA: NEGATIVE mg/dL
Hgb urine dipstick: NEGATIVE
KETONES UR: NEGATIVE mg/dL
Leukocytes, UA: NEGATIVE
Nitrite: NEGATIVE
PH: 5 (ref 5.0–8.0)
Protein, ur: NEGATIVE mg/dL
Specific Gravity, Urine: 1.009 (ref 1.005–1.030)

## 2018-06-22 LAB — CBG MONITORING, ED: Glucose-Capillary: 109 mg/dL — ABNORMAL HIGH (ref 70–99)

## 2018-06-22 LAB — CBC WITH DIFFERENTIAL/PLATELET
BASOS PCT: 0 %
Basophils Absolute: 0 10*3/uL (ref 0.0–0.1)
Eosinophils Absolute: 0.1 10*3/uL (ref 0.0–0.7)
Eosinophils Relative: 1 %
HEMATOCRIT: 41.8 % (ref 36.0–46.0)
HEMOGLOBIN: 15.2 g/dL — AB (ref 12.0–15.0)
LYMPHS ABS: 1.7 10*3/uL (ref 0.7–4.0)
Lymphocytes Relative: 20 %
MCH: 33.5 pg (ref 26.0–34.0)
MCHC: 36.4 g/dL — ABNORMAL HIGH (ref 30.0–36.0)
MCV: 92.1 fL (ref 78.0–100.0)
Monocytes Absolute: 0.5 10*3/uL (ref 0.1–1.0)
Monocytes Relative: 6 %
NEUTROS ABS: 6.4 10*3/uL (ref 1.7–7.7)
NEUTROS PCT: 73 %
Platelets: 392 10*3/uL (ref 150–400)
RBC: 4.54 MIL/uL (ref 3.87–5.11)
RDW: 12.7 % (ref 11.5–15.5)
WBC: 8.7 10*3/uL (ref 4.0–10.5)

## 2018-06-22 MED ORDER — DOXYCYCLINE HYCLATE 100 MG PO CAPS: 100.0000 mg | ORAL_CAPSULE | Freq: Two times a day (BID) | ORAL | 0 refills | Status: DC

## 2018-06-22 MED ORDER — SODIUM CHLORIDE 0.9 % IV BOLUS
1000.0000 mL | Freq: Once | INTRAVENOUS | Status: AC
  Administered 2018-06-22: 1000 mL via INTRAVENOUS

## 2018-06-22 MED ORDER — KETOROLAC TROMETHAMINE 30 MG/ML IJ SOLN
30.0000 mg | Freq: Once | INTRAMUSCULAR | Status: AC
  Administered 2018-06-22: 30 mg via INTRAVENOUS
  Filled 2018-06-22: qty 1

## 2018-06-22 MED ORDER — SODIUM CHLORIDE 0.9 % IV SOLN: INTRAVENOUS | Status: DC

## 2018-06-22 NOTE — Discharge Instructions (Addendum)
1. Medications: doxycycline, usual home medications 2. Treatment: rest, drink plenty of fluids, warm compresses on left breast 3. Follow Up: Please followup with your primary doctor and OB/GYN in 2-3 days for discussion of your diagnoses and further evaluation after today's visit; if you do not have a primary care doctor use the resource guide provided to find one; Please return to the ER for return of symptoms, additional syncope, fevers, vomiting, vision changes or other concerns

## 2018-06-22 NOTE — ED Notes (Signed)
EKG given to EDP,Wickline,MD., for review. 

## 2018-06-22 NOTE — ED Triage Notes (Signed)
Pt arriving via [facility] following a syncopal episode at home. Pt reports blacking out and hitting her face. Pt has small cut on her chin. Also complaining of headache. Pt also voicing concerns regarding a small wound under her left breast unrelated to syncopal episode. Possible marijuana use.   A&O x4 and ambulatory. Not on blood thinners.

## 2018-06-22 NOTE — ED Provider Notes (Signed)
[facility]-EMERGENCY DEPT Provider Note   CSN: [id number] Arrival date & time: 06/22/18  0006     History   Chief Complaint Chief Complaint  Patient presents with  . Loss of Consciousness    HPI Julie Coffey is a 37 y.o. female with a hx of abdominal hysterectomy, teratoma excision of the left chest presents to the Emergency Department complaining of acute syncopal episode onset just prior to arrival.  She has associated left-sided headache and abrasion of the left chin.  She denies vision changes.  Patient's daughter reports that patient was acting strangely and while walking down the hallway to her bedroom fell, striking the left side of her face on the wall and then onto the floor.  Daughter reports patient was unresponsive for approximately 10 seconds and was oriented upon her arrival.  They deny seizure-like activity.  Patient denies previous syncopal episodes.  She denies chest pain or shortness of breath prior to the episode.  Patient does report she has a wound to the left breast that has been draining purulent drainage for several days.  She denies fevers or chills, chest pain, shortness of breath, abdominal pain, nausea, vomiting, diarrhea, weakness, dysuria, hematuria.  Patient does report midline neck pain worse with movement.  No known aggravating or alleviating factors.   The history is provided by the patient, a relative, medical records and a parent. No language interpreter was used.    History reviewed. No pertinent past medical history.  There are no active problems to display for this patient.   Past Surgical History:  Procedure Laterality Date  . ABDOMINAL HYSTERECTOMY    . CESAREAN SECTION     x3  . TERATOMA EXCISION       OB History   None      Home Medications    Prior to Admission medications   Medication Sig Start Date End Date Taking? Authorizing Provider  ibuprofen (ADVIL,MOTRIN) 200 MG tablet Take 200 mg by mouth  every 6 (six) hours as needed for moderate pain.   Yes [provider]  albuterol (PROVENTIL HFA;VENTOLIN HFA) 108 (90 Base) MCG/ACT inhaler Inhale 1-2 puffs into the lungs every 6 (six) hours as needed for wheezing or shortness of breath. Patient not taking: Reported on 06/22/2018 10/20/15   Trixie Dredge, PA-C  chlorpheniramine-HYDROcodone [facility]) 10-8 MG/5ML SUER Take 5 mLs by mouth every 12 (twelve) hours as needed for cough (and pain). Patient not taking: Reported on 06/22/2018 10/20/15   Trixie Dredge, PA-C  doxycycline (VIBRAMYCIN) 100 MG capsule Take 1 capsule (100 mg total) by mouth 2 (two) times daily. 06/22/18   Antolin Belsito, Dahlia Client, PA-C    Family History No family history on file.  Social History Social History   Tobacco Use  . Smoking status: Current Every Day Smoker    Packs/day: 0.50    Types: Cigarettes  . Tobacco comment: took chantix so smokes 1-2 cigs/day  Substance Use Topics  . Alcohol use: Yes    Comment: occasionally  . Drug use: No     Allergies   Patient has no known allergies.   Review of Systems Review of Systems  Constitutional: Negative for appetite change, diaphoresis, fatigue, fever and unexpected weight change.  HENT: Positive for facial swelling. Negative for mouth sores.   Eyes: Negative for visual disturbance.  Respiratory: Negative for cough, chest tightness, shortness of breath and wheezing.   Cardiovascular: Positive for syncope. Negative for chest pain.  Gastrointestinal: Negative for abdominal pain,  constipation, diarrhea, nausea and vomiting.  Endocrine: Negative for polydipsia, polyphagia and polyuria.  Genitourinary: Negative for dysuria, frequency, hematuria and urgency.  Musculoskeletal: Positive for neck pain. Negative for back pain and neck stiffness.  Skin: Negative for rash.  Allergic/Immunologic: Negative for immunocompromised state.  Neurological: Positive for syncope and headaches. Negative for  light-headedness.  Hematological: Does not bruise/bleed easily.  Psychiatric/Behavioral: Negative for sleep disturbance. The patient is not nervous/anxious.      Physical Exam Updated Vital Signs BP (!) 143/94 (BP Location: Left Arm)   Pulse 94   Temp 98.5 F (36.9 C) (Oral)   Resp 18   SpO2 100%   Physical Exam  Constitutional: She is oriented to person, place, and time. She appears well-developed and well-nourished. No distress.  HENT:  Head: Normocephalic. Head is with abrasion.    Mouth/Throat: Oropharynx is clear and moist.  No trismus  Eyes: Pupils are equal, round, and reactive to light. Conjunctivae and EOM are normal. No scleral icterus.  No horizontal, vertical or rotational nystagmus  Neck: Spinous process tenderness and muscular tenderness present.  Range of motion not tested - c-collar applied Mild midline and paraspinal tenderness  Cardiovascular: Normal rate, regular rhythm and intact distal pulses.  Pulmonary/Chest: Effort normal and breath sounds normal. No respiratory distress. She has no wheezes. She has no rales. Left breast exhibits skin change and tenderness. There is breast swelling and discharge.    Abdominal: Soft. Bowel sounds are normal. There is no tenderness. There is no rebound and no guarding.  Musculoskeletal: Normal range of motion.  Lymphadenopathy:    She has no cervical adenopathy.  Neurological: She is alert and oriented to person, place, and time. No cranial nerve deficit. She exhibits normal muscle tone. Coordination normal.  Mental Status:  Alert, oriented, thought content appropriate. Speech fluent without evidence of aphasia. Able to follow 2 step commands without difficulty.  Cranial Nerves:  II:  Peripheral visual fields grossly normal, pupils equal, round, reactive to light III,IV, VI: ptosis not present, extra-ocular motions intact bilaterally  V,VII: smile symmetric, facial light touch sensation equal VIII: hearing grossly  normal bilaterally  IX,X: midline uvula rise  XI: bilateral shoulder shrug equal and strong XII: midline tongue extension  Motor:  5/5 in upper and lower extremities bilaterally including strong and equal grip strength and dorsiflexion/plantar flexion Sensory: Pinprick and light touch normal in all extremities.  Cerebellar: normal finger-to-nose with bilateral upper extremities Gait: gait testing deferred CV: distal pulses palpable throughout   Skin: Skin is warm and dry. No rash noted. She is not diaphoretic.  Psychiatric: She has a normal mood and affect. Her behavior is normal. Judgment and thought content normal.  Nursing note and vitals reviewed.    ED Treatments / Results  Labs (all labs ordered are listed, but only abnormal results are displayed) Labs Reviewed  CBC WITH DIFFERENTIAL/PLATELET - Abnormal; Notable for the following components:      Result Value   Hemoglobin 15.2 (*)    MCHC 36.4 (*)    All other components within normal limits  COMPREHENSIVE METABOLIC PANEL - Abnormal; Notable for the following components:   Creatinine, Ser 1.14 (*)    All other components within normal limits  CBG MONITORING, ED - Abnormal; Notable for the following components:   Glucose-Capillary 109 (*)    All other components within normal limits  URINALYSIS, ROUTINE W REFLEX MICROSCOPIC  I-STAT BETA HCG BLOOD, ED ([facility], [facility], [facility] ONLY)  I-STAT TROPONIN, ED  I-STAT  CG4 LACTIC ACID, ED  I-STAT CG4 LACTIC ACID, ED    EKG EKG Interpretation  Date/Time:  Saturday June 22 2018 00:57:27 EDT Ventricular Rate:  83 PR Interval:    QRS Duration: 83 QT Interval:  352 QTC Calculation: 414 R Axis:   51 Text Interpretation:  Sinus rhythm ST elev, probable normal early repol pattern No previous ECGs available Confirmed by Zadie RhineWickline, Donald ([id number]) on 06/22/2018 1:01:03 AM   Radiology Ct Head Wo Contrast  Result Date: 06/22/2018 CLINICAL DATA:  37 year old female with maxillofacial trauma.  EXAM: CT HEAD WITHOUT CONTRAST CT MAXILLOFACIAL WITHOUT CONTRAST CT CERVICAL SPINE WITHOUT CONTRAST TECHNIQUE: Multidetector CT imaging of the head, cervical spine, and maxillofacial structures were performed using the standard protocol without intravenous contrast. Multiplanar CT image reconstructions of the cervical spine and maxillofacial structures were also generated. COMPARISON:  None. FINDINGS: CT HEAD FINDINGS Brain: The ventricles and sulci appropriate size for patient's age. The gray-white matter discrimination is preserved. There is no acute intracranial hemorrhage. No mass effect or midline shift. No extra-axial fluid collection. Vascular: No hyperdense vessel or unexpected calcification. Skull: Normal. Negative for fracture or focal lesion. Other: None CT MAXILLOFACIAL FINDINGS Osseous: Faint linear lucency through the right zygomatic arch (series 15, image 45) may represent a suture or a nondisplaced fracture. Correlation with clinical exam and point tenderness recommended. No other acute fracture identified. No mandibular dislocation. Orbits: Negative. No traumatic or inflammatory finding. Sinuses: Clear. Soft tissues: Negative. CT CERVICAL SPINE FINDINGS Alignment: Normal. Skull base and vertebrae: No acute fracture. No primary bone lesion or focal pathologic process. Soft tissues and spinal canal: No prevertebral fluid or swelling. No visible canal hematoma. Disc levels:  No acute findings. Upper chest: Negative. Other: None IMPRESSION: 1. Normal noncontrast CT of the brain. 2. No acute/traumatic cervical spine pathology. 3. Suture versus less likely a nondisplaced fracture of the right zygomatic arch. Correlation with clinical exam and point tenderness recommended. Electronically Signed   By: Elgie CollardArash  Radparvar M.D.   On: 06/22/2018 02:43   Ct Cervical Spine Wo Contrast  Result Date: 06/22/2018 CLINICAL DATA:  37 year old female with maxillofacial trauma. EXAM: CT HEAD WITHOUT CONTRAST CT  MAXILLOFACIAL WITHOUT CONTRAST CT CERVICAL SPINE WITHOUT CONTRAST TECHNIQUE: Multidetector CT imaging of the head, cervical spine, and maxillofacial structures were performed using the standard protocol without intravenous contrast. Multiplanar CT image reconstructions of the cervical spine and maxillofacial structures were also generated. COMPARISON:  None. FINDINGS: CT HEAD FINDINGS Brain: The ventricles and sulci appropriate size for patient's age. The gray-white matter discrimination is preserved. There is no acute intracranial hemorrhage. No mass effect or midline shift. No extra-axial fluid collection. Vascular: No hyperdense vessel or unexpected calcification. Skull: Normal. Negative for fracture or focal lesion. Other: None CT MAXILLOFACIAL FINDINGS Osseous: Faint linear lucency through the right zygomatic arch (series 15, image 45) may represent a suture or a nondisplaced fracture. Correlation with clinical exam and point tenderness recommended. No other acute fracture identified. No mandibular dislocation. Orbits: Negative. No traumatic or inflammatory finding. Sinuses: Clear. Soft tissues: Negative. CT CERVICAL SPINE FINDINGS Alignment: Normal. Skull base and vertebrae: No acute fracture. No primary bone lesion or focal pathologic process. Soft tissues and spinal canal: No prevertebral fluid or swelling. No visible canal hematoma. Disc levels:  No acute findings. Upper chest: Negative. Other: None IMPRESSION: 1. Normal noncontrast CT of the brain. 2. No acute/traumatic cervical spine pathology. 3. Suture versus less likely a nondisplaced fracture of the right zygomatic arch. Correlation with clinical exam  and point tenderness recommended. Electronically Signed   By: Elgie Collard M.D.   On: 06/22/2018 02:43   Ct Maxillofacial Wo Contrast  Result Date: 06/22/2018 CLINICAL DATA:  37 year old female with maxillofacial trauma. EXAM: CT HEAD WITHOUT CONTRAST CT MAXILLOFACIAL WITHOUT CONTRAST CT CERVICAL  SPINE WITHOUT CONTRAST TECHNIQUE: Multidetector CT imaging of the head, cervical spine, and maxillofacial structures were performed using the standard protocol without intravenous contrast. Multiplanar CT image reconstructions of the cervical spine and maxillofacial structures were also generated. COMPARISON:  None. FINDINGS: CT HEAD FINDINGS Brain: The ventricles and sulci appropriate size for patient's age. The gray-white matter discrimination is preserved. There is no acute intracranial hemorrhage. No mass effect or midline shift. No extra-axial fluid collection. Vascular: No hyperdense vessel or unexpected calcification. Skull: Normal. Negative for fracture or focal lesion. Other: None CT MAXILLOFACIAL FINDINGS Osseous: Faint linear lucency through the right zygomatic arch (series 15, image 45) may represent a suture or a nondisplaced fracture. Correlation with clinical exam and point tenderness recommended. No other acute fracture identified. No mandibular dislocation. Orbits: Negative. No traumatic or inflammatory finding. Sinuses: Clear. Soft tissues: Negative. CT CERVICAL SPINE FINDINGS Alignment: Normal. Skull base and vertebrae: No acute fracture. No primary bone lesion or focal pathologic process. Soft tissues and spinal canal: No prevertebral fluid or swelling. No visible canal hematoma. Disc levels:  No acute findings. Upper chest: Negative. Other: None IMPRESSION: 1. Normal noncontrast CT of the brain. 2. No acute/traumatic cervical spine pathology. 3. Suture versus less likely a nondisplaced fracture of the right zygomatic arch. Correlation with clinical exam and point tenderness recommended. Electronically Signed   By: Elgie Collard M.D.   On: 06/22/2018 02:43    Procedures Procedures (including critical care time)  Medications Ordered in ED Medications  sodium chloride 0.9 % bolus 1,000 mL (0 mLs Intravenous Stopped 06/22/18 0422)    And  0.9 %  sodium chloride infusion (has no  administration in time range)  ketorolac (TORADOL) 30 MG/ML injection 30 mg (30 mg Intravenous Given 06/22/18 0414)     Initial Impression / Assessment and Plan / ED Course  I have reviewed the triage vital signs and the nursing notes.  Pertinent labs & imaging results that were available during my care of the patient were reviewed by me and considered in my medical decision making (see chart for details).     Presents after syncopal episode.  Patient did strike her head and is complaining of a headache and neck pain.  CT scan of her head and neck are without acute abnormality including no intracranial hemorrhage.  I personally evaluated these images.  Patient was slightly elevated serum creatinine.  I have no previous for comparison.  Fluids given.  Requested that patient follow with primary care for recheck of serum creatinine.  All other labs are reassuring.  Hemoglobin is elevated consistent with likely hemoconcentration.  Urinalysis without ketones or increased specific gravity.  No evidence of urinary tract infection.  Patient is afebrile without tachycardia.  No hypotension or hypoxia.  Patient does have evidence of left breast cellulitis which is currently draining.  No clinical evidence of sepsis.  C-collar, placed at initial exam was removed with full range of motion.  No evidence of ligamentous injury.  Patient has tolerated p.o. fluids and solids and has ambulate here in the emergency department without difficulty.  Steady gait and no additional episodes of lightheadedness, syncope or near syncope.  CT scan shows faint linear lucency through the right zygomatic  arch however patient's facial pain is all on the left.  Less likely to be fracture.  Personally evaluated these images.  No evidence of intracranial hemorrhage, subdural or subarachnoid hemorrhage.  Discussed unknown etiology of syncope with patient.  Throughout her time here in the emergency department she has had no arrhythmias.   Her EKG is without ischemia.  She is well-appearing at this time.  Patient will need close follow-up with her primary care provider.  Patient, mother and daughter state understanding.  She will be discharged home with a prescription for doxycycline for her left breast cellulitis.  She is to see OB/GYN in 2 days for wound check and potential referral for breast ultrasound.    Final Clinical Impressions(s) / ED Diagnoses   Final diagnoses:  Syncope and collapse  Cellulitis of left breast    ED Discharge Orders         Ordered    doxycycline (VIBRAMYCIN) 100 MG capsule  2 times daily,   Status:  Discontinued     06/22/18 0434    doxycycline (VIBRAMYCIN) 100 MG capsule  2 times daily     06/22/18 0435           Monay Houlton, Dahlia Client, PA-C 06/22/18 1610    Zadie Rhine, MD 06/22/18 [phone]

## 2018-06-22 NOTE — ED Notes (Addendum)
Pt. CBG 109, RN,Lindsay made aware.

## 2018-06-22 NOTE — ED Notes (Signed)
Bed: [id number] Expected date:  Expected time:  Means of arrival:  Comments: 36F syncope

## 2018-06-22 NOTE — ED Notes (Signed)
Hard cervical collar applied. Pt to CT.

## 2018-07-18 ENCOUNTER — Encounter: Payer: Self-pay | Admitting: Family Medicine

## 2018-07-18 ENCOUNTER — Ambulatory Visit: Payer: Managed Care, Other (non HMO) | Admitting: Family Medicine

## 2018-07-18 VITALS — BP 142/84 | HR 96 | Ht 68.0 in | Wt 237.9 lb

## 2018-07-18 DIAGNOSIS — N611 Abscess of the breast and nipple: Secondary | ICD-10-CM | POA: Diagnosis not present

## 2018-07-18 MED ORDER — SULFAMETHOXAZOLE-TRIMETHOPRIM 800-160 MG PO TABS: 1.0000 | ORAL_TABLET | Freq: Two times a day (BID) | ORAL | 1 refills | Status: DC

## 2018-07-18 NOTE — Progress Notes (Signed)
   Subjective:    Patient ID: Julie Coffey, female    DOB: [birth year], 37 y.o.   MRN: [id number]  HPI  Patient seen for left breast abscess - was referred here from ED. Was placed on Doxycycline, which cleared up infection, but has started to get another infection on same breast. Started 2 days ago. Tender, hot to touch. Approximately 2cm in diameter. Increasing in size. Has been putting H2O2 on it, which hasn't helped. No other palliating or provoking factors.   Had hystectomy over 10 years ago for abnormal PAP and precancerous cells. No PAPs since that time.  I have reviewed the patients past medical, family, and social history.  I have reviewed the patient's medication list and allergies.  Review of Systems  All other systems reviewed and are negative.     Objective:   Physical Exam  Constitutional: She appears well-developed and well-nourished.  HENT:  Head: Normocephalic and atraumatic.  Cardiovascular: Normal rate.  Pulmonary/Chest: Effort normal.    Abdominal: Soft.       Assessment & Plan:  1. Left breast abscess Start bactrim. Breast imaging ordered. Follow up for Annual exam - MM DIAG BREAST TOMO BILATERAL; Future - US BREAST LTD UNI LEFT INC AXILLA; Future

## 2018-07-23 ENCOUNTER — Other Ambulatory Visit: Payer: Managed Care, Other (non HMO)

## 2018-07-26 ENCOUNTER — Ambulatory Visit
Admission: RE | Admit: 2018-07-26 | Discharge: 2018-07-26 | Disposition: A | Discharge status: Home / Self Care | Payer: Managed Care, Other (non HMO) | Source: Ambulatory Visit | Attending: Family Medicine | Admitting: Family Medicine

## 2018-07-26 DIAGNOSIS — N611 Abscess of the breast and nipple: Secondary | ICD-10-CM

## 2018-08-19 ENCOUNTER — Encounter: Payer: Self-pay | Admitting: Family Medicine

## 2018-08-19 ENCOUNTER — Ambulatory Visit (INDEPENDENT_AMBULATORY_CARE_PROVIDER_SITE_OTHER): Payer: Managed Care, Other (non HMO) | Admitting: Family Medicine

## 2018-08-19 VITALS — BP 144/79 | HR 103 | Wt 243.4 lb

## 2018-08-19 DIAGNOSIS — Z01419 Encounter for gynecological examination (general) (routine) without abnormal findings: Secondary | ICD-10-CM | POA: Diagnosis not present

## 2018-08-19 DIAGNOSIS — Z113 Encounter for screening for infections with a predominantly sexual mode of transmission: Secondary | ICD-10-CM

## 2018-08-19 DIAGNOSIS — Z124 Encounter for screening for malignant neoplasm of cervix: Secondary | ICD-10-CM | POA: Diagnosis not present

## 2018-08-19 DIAGNOSIS — N611 Abscess of the breast and nipple: Secondary | ICD-10-CM

## 2018-08-19 DIAGNOSIS — Z716 Tobacco abuse counseling: Secondary | ICD-10-CM

## 2018-08-19 DIAGNOSIS — Z0000 Encounter for general adult medical examination without abnormal findings: Secondary | ICD-10-CM

## 2018-08-19 MED ORDER — NICOTINE POLACRILEX 2 MG MT GUM: 2.0000 mg | CHEWING_GUM | OROMUCOSAL | 0 refills | Status: DC | PRN

## 2018-08-19 MED ORDER — NYSTATIN 100000 UNIT/GM EX POWD: Freq: Four times a day (QID) | CUTANEOUS | 0 refills | Status: DC

## 2018-08-19 MED ORDER — CHLORHEXIDINE GLUCONATE CLOTH 2 % EX PADS: 6.0000 | MEDICATED_PAD | Freq: Every day | CUTANEOUS | 0 refills | Status: DC

## 2018-08-19 NOTE — Addendum Note (Signed)
Addended by: Faythe Casa on: 08/19/2018 03:39 PM   Modules accepted: Orders

## 2018-08-19 NOTE — Progress Notes (Addendum)
GYNECOLOGY ANNUAL PREVENTATIVE CARE ENCOUNTER NOTE  Subjective:   Julie Coffey is a 37 y.o. No obstetric history on file. female here for a routine annual gynecologic exam.  Current complaints: having recurrent breast infections. Currently on bactrim. Desires tobacco cessation.  Denies abnormal vaginal bleeding, discharge, pelvic pain, problems with intercourse or other gynecologic concerns.    Had hysterectomy due to "cancer" concerns.  Gynecologic History No LMP recorded. Patient has had a hysterectomy. Patient is not sexually active  Contraception: status post hysterectomy Last Pap: > 3 yrs. Results were: normal Last mammogram: last month.  Obstetric History OB History  No data available    Past Medical History:  Diagnosis Date  . HSV infection     Past Surgical History:  Procedure Laterality Date  . ABDOMINAL HYSTERECTOMY    . CESAREAN SECTION     x3  . TERATOMA EXCISION      Current Outpatient Medications on File Prior to Visit  Medication Sig Dispense Refill  . ibuprofen (ADVIL,MOTRIN) 200 MG tablet Take 200 mg by mouth every 6 (six) hours as needed for moderate pain.     No current facility-administered medications on file prior to visit.     No Known Allergies  Social History   Socioeconomic History  . Marital status: Single    Spouse name: Not on file  . Number of children: Not on file  . Years of education: Not on file  . Highest education level: Not on file  Occupational History  . Not on file  Social Needs  . Financial resource strain: Not on file  . Food insecurity:    Worry: Not on file    Inability: Not on file  . Transportation needs:    Medical: Not on file    Non-medical: Not on file  Tobacco Use  . Smoking status: Current Every Day Smoker    Packs/day: 0.50    Types: Cigarettes  . Smokeless tobacco: Never Used  . Tobacco comment: took chantix so smokes 1-2 cigs/day  Substance and Sexual Activity  . Alcohol use: Yes   Comment: occasionally  . Drug use: Yes    Types: Marijuana  . Sexual activity: Not Currently    Birth control/protection: Surgical  Lifestyle  . Physical activity:    Days per week: Not on file    Minutes per session: Not on file  . Stress: Not on file  Relationships  . Social connections:    Talks on phone: Not on file    Gets together: Not on file    Attends religious service: Not on file    Active member of club or organization: Not on file    Attends meetings of clubs or organizations: Not on file    Relationship status: Not on file  . Intimate partner violence:    Fear of current or ex partner: Not on file    Emotionally abused: Not on file    Physically abused: Not on file    Forced sexual activity: Not on file  Other Topics Concern  . Not on file  Social History Narrative  . Not on file    No family history on file.  The following portions of the patient's history were reviewed and updated as appropriate: allergies, current medications, past family history, past medical history, past social history, past surgical history and problem list.  Review of Systems Pertinent items are noted in HPI.   Objective:  BP (!) 144/79   Pulse (!) 103  Wt 243 lb 6.4 oz (110.4 kg)   BMI 37.01 kg/m  CONSTITUTIONAL: Well-developed, well-nourished female in no acute distress.  HENT:  Normocephalic, atraumatic, External right and left ear normal. Oropharynx is clear and moist EYES: Conjunctivae and EOM are normal. Pupils are equal, round, and reactive to light. No scleral icterus.  NECK: Normal range of motion, supple, no masses.  Normal thyroid.   CARDIOVASCULAR: Normal heart rate noted, regular rhythm RESPIRATORY: Clear to auscultation bilaterally. Effort and breath sounds normal, no problems with respiration noted. BREASTS: Symmetric in size. No masses, skin changes, nipple drainage, or lymphadenopathy. ABDOMEN: Soft, normal bowel sounds, no distention noted.  No tenderness,  rebound or guarding.  PELVIC: Normal appearing external genitalia; normal appearing vaginal mucosa.  No abnormal discharge noted. Uterus surgically absent. MUSCULOSKELETAL: Normal range of motion. No tenderness.  No cyanosis, clubbing, or edema.  2+ distal pulses. SKIN: Skin is warm and dry. No rash noted. Not diaphoretic. No erythema. No pallor. NEUROLOGIC: Alert and oriented to person, place, and time. Normal reflexes, muscle tone coordination. No cranial nerve deficit noted. PSYCHIATRIC: Normal mood and affect. Normal behavior. Normal judgment and thought content.  Assessment:  Annual gynecologic examination with pap smear Tobacco cessation   Plan:  1. Well Woman Exam Will get records. Will follow up results of pap smear and manage accordingly. STD testing discussed. Patient requested testing: blood testing - Lipid panel - TSH - CBC - Hepatitis C antibody - HIV Antibody (routine testing w rflx) - RPR  2. Left breast abscess chlorhexadine wipes prescribed  3. Encounter for tobacco use cessation counseling Discussed cessation topics: has used patch, chantix, and wellbutrin. All three gave her nightmares. Discussed gum - will prescribe.    Routine preventative health maintenance measures emphasized. Please refer to After Visit Summary for other counseling recommendations.    Candelaria Celeste, DO [facility]

## 2018-08-20 LAB — CBC
HEMOGLOBIN: 14.6 g/dL (ref 11.1–15.9)
Hematocrit: 42 % (ref 34.0–46.6)
MCH: 32.7 pg (ref 26.6–33.0)
MCHC: 34.8 g/dL (ref 31.5–35.7)
MCV: 94 fL (ref 79–97)
PLATELETS: 377 10*3/uL (ref 150–450)
RBC: 4.46 x10E6/uL (ref 3.77–5.28)
RDW: 13.1 % (ref 12.3–15.4)
WBC: 7.3 10*3/uL (ref 3.4–10.8)

## 2018-08-20 LAB — LIPID PANEL
CHOLESTEROL TOTAL: 189 mg/dL (ref 100–199)
Chol/HDL Ratio: 3.9 ratio (ref 0.0–4.4)
HDL: 49 mg/dL (ref >39)
LDL CALC: 126 mg/dL — AB (ref 0–99)
Triglycerides: 69 mg/dL (ref 0–149)
VLDL Cholesterol Cal: 14 mg/dL (ref 5–40)

## 2018-08-20 LAB — TSH: TSH: 1.72 u[IU]/mL (ref 0.450–4.500)

## 2018-08-20 LAB — HIV ANTIBODY (ROUTINE TESTING W REFLEX): HIV Screen 4th Generation wRfx: NONREACTIVE

## 2018-08-20 LAB — RPR: RPR: NONREACTIVE

## 2018-08-20 LAB — HEPATITIS C ANTIBODY

## 2018-08-21 LAB — CYTOLOGY - PAP
Diagnosis: NEGATIVE
HPV: NOT DETECTED

## 2019-03-19 ENCOUNTER — Telehealth: Payer: Self-pay | Admitting: Lactation Services

## 2019-03-19 NOTE — Telephone Encounter (Signed)
Attempted to call pt to follow up. Received message that call was unable to be completed.

## 2019-03-19 NOTE — Telephone Encounter (Signed)
Attempted to call pt in regards to breast abscess x 2. Phone said cannot complete call at this time. Routed to Clinical Pool for follow up.

## 2019-03-19 NOTE — Telephone Encounter (Signed)
-----   Message from Lanney Gins, CMA sent at 03/14/2019  9:02 AM EDT ----- Regarding: Breast problem Pt called to our office stating she saw Dr Adrian Blackwater and was given treatment for a breast wound. Pt states she has same kind of wound again and would like treatment before making appt.  Can you please call pt and follow up. She was seen in [facility].

## 2019-03-27 ENCOUNTER — Telehealth: Payer: Self-pay | Admitting: Family Medicine

## 2019-03-27 NOTE — Telephone Encounter (Signed)
Attempted to call patient to have her scheduled for her breast abscess with a provider. No answer, number stated that the call could not be completed at this time. Scheduled the appointment for 6/22 @ 2:15 and mailed the patient an appointment reminder.

## 2019-04-14 ENCOUNTER — Ambulatory Visit: Payer: Managed Care, Other (non HMO) | Admitting: Family Medicine

## 2019-04-14 ENCOUNTER — Telehealth: Payer: Self-pay | Admitting: Family Medicine

## 2019-04-14 NOTE — Telephone Encounter (Signed)
Attempted to call patient about her appointment, and how she needed to have her MyChart app downloaded in order to have this visit. I was not able to get her.

## 2019-04-29 ENCOUNTER — Encounter: Payer: Self-pay | Admitting: *Deleted

## 2019-06-04 ENCOUNTER — Telehealth: Payer: Self-pay | Admitting: Family Medicine

## 2019-06-04 NOTE — Telephone Encounter (Signed)
LVM for pt to call me back to schedule an appt for 2:00 on 06/06/2019 with Dr. Nolon Rod

## 2019-06-06 ENCOUNTER — Encounter: Payer: Self-pay | Admitting: Family Medicine

## 2019-06-06 ENCOUNTER — Other Ambulatory Visit: Payer: Self-pay

## 2019-06-06 ENCOUNTER — Ambulatory Visit (INDEPENDENT_AMBULATORY_CARE_PROVIDER_SITE_OTHER): Payer: Managed Care, Other (non HMO) | Admitting: Family Medicine

## 2019-06-06 VITALS — BP 121/83 | HR 92 | Temp 98.6°F | Resp 17 | Ht 68.0 in | Wt 259.0 lb

## 2019-06-06 DIAGNOSIS — Z299 Encounter for prophylactic measures, unspecified: Secondary | ICD-10-CM | POA: Diagnosis not present

## 2019-06-06 DIAGNOSIS — L304 Erythema intertrigo: Secondary | ICD-10-CM | POA: Diagnosis not present

## 2019-06-06 DIAGNOSIS — Z13220 Encounter for screening for lipoid disorders: Secondary | ICD-10-CM

## 2019-06-06 DIAGNOSIS — L732 Hidradenitis suppurativa: Secondary | ICD-10-CM | POA: Diagnosis not present

## 2019-06-06 MED ORDER — DOXYCYCLINE HYCLATE 100 MG PO TABS: 100.0000 mg | ORAL_TABLET | Freq: Two times a day (BID) | ORAL | 0 refills | Status: AC

## 2019-06-06 MED ORDER — FLUCONAZOLE 150 MG PO TABS: ORAL_TABLET | ORAL | 0 refills | Status: DC

## 2019-06-06 MED ORDER — CHLORHEXIDINE GLUCONATE 4 % EX LIQD: Freq: Every day | CUTANEOUS | 0 refills | Status: DC | PRN

## 2019-06-06 MED ORDER — CLINDAMYCIN PHOSPHATE 1 % EX LOTN: TOPICAL_LOTION | Freq: Two times a day (BID) | CUTANEOUS | 1 refills | Status: DC

## 2019-06-06 NOTE — Progress Notes (Signed)
Established Patient Office Visit  Subjective:  Patient ID: Julie Formdrienne Gawthrop, female    DOB: [birth year]  Age: 38 y.o. MRN: [id number]  CC:  Chief Complaint  Patient presents with  . New Patient (Initial Visit)    boil under right breast x 2-3 weeks,  per pt has a hole, did a virtual visit with [facility] and was placed on cephalexin 500 mg x bid and mupurocin ointment.  Pt is concerned about the hole.  Per pt she cleans under her breast bid with 91% alcohol.  This is the 2nd round of antibiotics she's been on for this, the first one  was bactrim.  Dx with cellulitis-had diagnostic mammogram-staph seen by doc.  Pt is really worried.    HPI Julie Coffey presents for   Patient reports that she was having boil under the breasts She completed bactrim then Keflex She states that she is cleaning it with 91% alcohol and peroxide She states that this started August 2019  She reports that she had mastitis of the right breast She states that she went to Gynecology     Diagnostic Mammogram with Ultrasound 07/26/2018  IMPRESSION: 1. Small area of ill-defined subdural fluid/edema within the LOWER LEFT breast, compatible with post infectious/postinflammatory change. 2. No mammographic evidence of breast malignancy.  RECOMMENDATION: Bilateral screening mammogram at age 38  I have discussed the findings and recommendations with the patient. Results were also provided in writing at the conclusion of the visit. If applicable, a reminder letter will be sent to the patient regarding the next appointment.  BI-RADS CATEGORY  2: Benign.   Past Medical History:  Diagnosis Date  . HSV infection     Past Surgical History:  Procedure Laterality Date  . ABDOMINAL HYSTERECTOMY    . CESAREAN SECTION     x3  . TERATOMA EXCISION      No family history on file.  Social History   Socioeconomic History  . Marital status: Single    Spouse name: Not on file  . Number of children: Not  on file  . Years of education: Not on file  . Highest education level: Not on file  Occupational History  . Not on file  Social Needs  . Financial resource strain: Not on file  . Food insecurity    Worry: Not on file    Inability: Not on file  . Transportation needs    Medical: Not on file    Non-medical: Not on file  Tobacco Use  . Smoking status: Current Every Day Smoker    Packs/day: 0.50    Types: Cigarettes  . Smokeless tobacco: Never Used  . Tobacco comment: took chantix so smokes 1-2 cigs/day  Substance and Sexual Activity  . Alcohol use: Yes    Comment: occasionally  . Drug use: Yes    Types: Marijuana  . Sexual activity: Not Currently    Birth control/protection: Surgical  Lifestyle  . Physical activity    Days per week: Not on file    Minutes per session: Not on file  . Stress: Not on file  Relationships  . Social Musicianconnections    Talks on phone: Not on file    Gets together: Not on file    Attends religious service: Not on file    Active member of club or organization: Not on file    Attends meetings of clubs or organizations: Not on file    Relationship status: Not on file  . Intimate partner violence  Fear of current or ex partner: Not on file    Emotionally abused: Not on file    Physically abused: Not on file    Forced sexual activity: Not on file  Other Topics Concern  . Not on file  Social History Narrative  . Not on file    Outpatient Medications Prior to Visit  Medication Sig Dispense Refill  . ibuprofen (ADVIL,MOTRIN) 200 MG tablet Take 200 mg by mouth every 6 (six) hours as needed for moderate pain.    . cephALEXin (KEFLEX) 500 MG capsule Take 500 mg by mouth 2 (two) times daily.    . nicotine polacrilex (NICORETTE) 2 MG gum Take 1 each (2 mg total) by mouth as needed for smoking cessation. (Patient not taking: Reported on 06/06/2019) 100 tablet 0  . nystatin (MYCOSTATIN/NYSTOP) powder Apply topically 4 (four) times daily. (Patient not  taking: Reported on 06/06/2019) 15 g 0  . Chlorhexidine Gluconate Cloth 2 % PADS Apply 6 each topically daily at 6 (six) AM. (Patient not taking: Reported on 06/06/2019) 6 each 0   No facility-administered medications prior to visit.     No Known Allergies  ROS Review of Systems Review of Systems  Constitutional: Negative for activity change, appetite change, chills and fever.  HENT: Negative for congestion, nosebleeds, trouble swallowing and voice change.   Respiratory: Negative for cough, shortness of breath and wheezing.   Gastrointestinal: Negative for diarrhea, nausea and vomiting.  Genitourinary: Negative for difficulty urinating, dysuria, flank pain and hematuria.  Musculoskeletal: Negative for back pain, joint swelling and neck pain.  Neurological: Negative for dizziness, speech difficulty, light-headedness and numbness.  See HPI. All other review of systems negative.     Objective:    Physical Exam  BP 121/83 (BP Location: Right Arm, Patient Position: Sitting, Cuff Size: Large)   Pulse 92   Temp 98.6 F (37 C) (Oral)   Resp 17   Ht 5\' 8"  (1.727 m)   Wt 259 lb (117.5 kg)   SpO2 96%   BMI 39.38 kg/m  Wt Readings from Last 3 Encounters:  06/06/19 259 lb (117.5 kg)  08/19/18 243 lb 6.4 oz (110.4 kg)  07/18/18 237 lb 14.4 oz (107.9 kg)   Physical Exam  Constitutional: Oriented to person, place, and time. Appears well-developed and well-nourished.  HENT:  Head: Normocephalic and atraumatic.  Eyes: Conjunctivae and EOM are normal.  Cardiovascular: Normal rate, regular rhythm, normal heart sounds and intact distal pulses.  No murmur heard. Pulmonary/Chest: Effort normal and breath sounds normal. No stridor. No respiratory distress. Has no wheezes.  Neurological: Is alert and oriented to person, place, and time.  Skin: Skin is warm. Capillary refill takes less than 2 seconds.  Psychiatric: Has a normal mood and affect. Behavior is normal. Judgment and thought content  normal.    Skin of breast with small pore under the right breast that looks like there was inflammation earlier but currently without inflammation or induration. Axilla without palpable nodes. No noticeable lymphedema or peu durange. Also breasts are nontender and are symmetric without nipple discharge.   Health Maintenance Due  Topic Date Due  . TETANUS/TDAP  04/10/1999  . INFLUENZA VACCINE  05/24/2019    There are no preventive care reminders to display for this patient.  Lab Results  Component Value Date   TSH 1.720 08/19/2018   Lab Results  Component Value Date   WBC 7.3 06/06/2019   HGB 14.9 06/06/2019   HCT 42.2 06/06/2019   MCV  94 06/06/2019   PLT 329 06/06/2019   Lab Results  Component Value Date   NA 138 06/06/2019   K 4.1 06/06/2019   CO2 21 06/06/2019   GLUCOSE 94 06/06/2019   BUN 7 06/06/2019   CREATININE 0.84 06/06/2019   BILITOT 0.4 06/22/2018   ALKPHOS 52 06/22/2018   AST 16 06/22/2018   ALT 22 06/22/2018   PROT 7.3 06/22/2018   ALBUMIN 4.4 06/22/2018   CALCIUM 9.4 06/06/2019   ANIONGAP 9 06/22/2018   Lab Results  Component Value Date   CHOL 189 08/19/2018   Lab Results  Component Value Date   HDL 49 08/19/2018   Lab Results  Component Value Date   LDLCALC 126 (H) 08/19/2018   Lab Results  Component Value Date   TRIG 69 08/19/2018   Lab Results  Component Value Date   CHOLHDL 3.9 08/19/2018   Lab Results  Component Value Date   HGBA1C 5.6 06/06/2019      Assessment & Plan:   Problem List Items Addressed This Visit    None    Visit Diagnoses    Screening for cholesterol level    -    Need for prophylactic measure       Intertrigo       Relevant Medications   fluconazole (DIFLUCAN) 150 MG tablet   Other Relevant Orders   Hemoglobin A1c (Completed)   CBC (Completed)   Basic metabolic panel (Completed)   TestT+TestF+SHBG (Completed)   Hidradenitis    -  Primary   Relevant Medications   fluconazole (DIFLUCAN) 150 MG  tablet   Other Relevant Orders   Hemoglobin A1c (Completed)   CBC (Completed)   Basic metabolic panel (Completed)   TestT+TestF+SHBG (Completed)      Medical Decision Making  Pt seems to start having eruption of breast lesion after increasing sweat This is quite distressing to patient Since she gets better after each round of antibiotic will do a prolonged treated for six weeks with doxycycline  Discussed topical skin care with hibiclens wash. Patting skin and applying tinactin powder to stay dry.  Will screen for underlying disorders such as diabetes, hormone abnormalities and inflammatory states.   Meds ordered this encounter  Medications  . chlorhexidine (HIBICLENS) 4 % external liquid    Sig: Apply topically daily as needed.    Dispense:  120 mL    Refill:  0  . clindamycin (CLEOCIN-T) 1 % lotion    Sig: Apply topically 2 (two) times daily.    Dispense:  60 mL    Refill:  1  . doxycycline (VIBRA-TABS) 100 MG tablet    Sig: Take 1 tablet (100 mg total) by mouth 2 (two) times daily. For six weeks    Dispense:  120 tablet    Refill:  0  . fluconazole (DIFLUCAN) 150 MG tablet    Sig: Take one tablet for vaginal irritation then repeat second dose in 3 days.    Dispense:  2 tablet    Refill:  0    Follow-up: No follow-ups on file.    Forrest Moron, MD

## 2019-06-06 NOTE — Patient Instructions (Addendum)
For skin care  Please take doxycycline for six weeks by mouth after eating a meal twice a day.  If you take it on an empty stomach you will vomiting When you wash you can use Hibiclens wash just 2-3 times a week Use topical cleocin T gel twice daily for six weeks  Keep the areas under the breast dry and when exercising use some tinactin foot powder under the breast to help keep it dry.    If you have lab work done today you will be contacted with your lab results within the next 2 weeks.  If you have not heard from us then please contact us. The fastest way to get your results is to register for My Chart.   IF you received an x-ray today, you will receive an invoice from [facility]. Please contact [facility] at [phone] with questions or concerns regarding your invoice.   IF you received labwork today, you will receive an invoice from [facility]. Please contact [facility] at [phone] with questions or concerns regarding your invoice.   Our billing staff will not be able to assist you with questions regarding bills from these companies.  You will be contacted with the lab results as soon as they are available. The fastest way to get your results is to activate your My Chart account. Instructions are located on the last page of this paperwork. If you have not heard from us regarding the results in 2 weeks, please contact this office.     Hidradenitis Suppurativa Hidradenitis suppurativa is a long-term (chronic) skin disease. It is similar to a severe form of acne, but it affects areas of the body where acne would be unusual, especially areas of the body where skin rubs against skin and becomes moist. These include:  Underarms.  Groin.  Genital area.  Buttocks.  Upper thighs.  Breasts. Hidradenitis suppurativa may start out as small lumps or pimples caused by blocked sweat glands or hair follicles. Pimples may develop into deep sores that break open (rupture)  and drain pus. Over time, affected areas of skin may thicken and become scarred. This condition is rare and does not spread from person to person (non-contagious). What are the causes? The exact cause of this condition is not known. It may be related to:  Female and female hormones.  An overactive disease-fighting system (immune system). The immune system may over-react to blocked hair follicles or sweat glands and cause swelling and pus-filled sores. What increases the risk? You are more likely to develop this condition if you:  Are female.  Are 9211-39 years old.  Have a family history of hidradenitis suppurativa.  Have a personal history of acne.  Are overweight.  Smoke.  Take the medicine lithium. What are the signs or symptoms? The first symptoms are usually painful bumps in the skin, similar to pimples. The condition may get worse over time (progress), or it may only cause mild symptoms. If the disease progresses, symptoms may include:  Skin bumps getting bigger and growing deeper into the skin.  Bumps rupturing and draining pus.  Itchy, infected skin.  Skin getting thicker and scarred.  Tunnels under the skin (fistulas) where pus drains from a bump.  Pain during daily activities, such as pain during walking if your groin area is affected.  Emotional problems, such as stress or depression. This condition may affect your appearance and your ability or willingness to wear certain clothes or do certain activities. How is this diagnosed? This condition  is diagnosed by a health care provider who specializes in skin diseases (dermatologist). You may be diagnosed based on:  Your symptoms and medical history.  A physical exam.  Testing a pus sample for infection.  Blood tests. How is this treated? Your treatment will depend on how severe your symptoms are. The same treatment will not work for everybody with this condition. You may need to try several treatments to find  what works best for you. Treatment may include:  Cleaning and bandaging (dressing) your wounds as needed.  Lifestyle changes, such as new skin care routines.  Taking medicines, such as: ? Antibiotics. ? Acne medicines. ? Medicines to reduce the activity of the immune system. ? A diabetes medicine (metformin). ? Birth control pills, for women. ? Steroids to reduce swelling and pain.  Working with a mental health care provider, if you experience emotional distress due to this condition. If you have severe symptoms that do not get better with medicine, you may need surgery. Surgery may involve:  Using a laser to clear the skin and remove hair follicles.  Opening and draining deep sores.  Removing the areas of skin that are diseased and scarred. Follow these instructions at home: Medicines   Take over-the-counter and prescription medicines only as told by your health care provider.  If you were prescribed an antibiotic medicine, take it as told by your health care provider. Do not stop taking the antibiotic even if your condition improves. Skin care  If you have open wounds, cover them with a clean dressing as told by your health care provider. Keep wounds clean by washing them gently with soap and water when you bathe.  Do not shave the areas where you get hidradenitis suppurativa.  Do not wear deodorant.  Wear loose-fitting clothes.  Try to avoid getting overheated or sweaty. If you get sweaty or wet, change into clean, dry clothes as soon as you can.  To help relieve pain and itchiness, cover sore areas with a warm, clean washcloth (warm compress) for 5-10 minutes as often as needed.  If told by your health care provider, take a bleach bath twice a week: ? Fill your bathtub halfway with water. ? Pour in  cup of unscented household bleach. ? Soak in the tub for 5-10 minutes. ? Only soak from the neck down. Avoid water on your face and hair. ? Shower to rinse off the  bleach from your skin. General instructions  Learn as much as you can about your disease so that you have an active role in your treatment. Work closely with your health care provider to find treatments that work for you.  If you are overweight, work with your health care provider to lose weight as recommended.  Do not use any products that contain nicotine or tobacco, such as cigarettes and e-cigarettes. If you need help quitting, ask your health care provider.  If you struggle with living with this condition, talk with your health care provider or work with a mental health care provider as recommended.  Keep all follow-up visits as told by your health care provider. This is important. Where to find more information  Hidradenitis [place].: [email or web address]://[email or web address]-[email or web address]/ Contact a health care provider if you have:  A flare-up of hidradenitis suppurativa.  A fever or chills.  Trouble controlling your symptoms at home.  Trouble doing your daily activities because of your symptoms.  Trouble dealing with emotional problems related to your condition. Summary  Hidradenitis suppurativa is a  long-term (chronic) skin disease. It is similar to a severe form of acne, but it affects areas of the body where acne would be unusual.  The first symptoms are usually painful bumps in the skin, similar to pimples. The condition may get worse over time (progress), or it may only cause mild symptoms.  If you have open wounds, cover them with a clean dressing as told by your health care provider. Keep wounds clean by washing them gently with soap and water when you bathe.  Besides skin care, treatment may include medicines, laser treatment, and surgery. This information is not intended to replace advice given to you by your health care provider. Make sure you discuss any questions you have with your health care provider. Document Released: 05/23/2004 Document Revised: 10/17/2017  Document Reviewed: 10/17/2017 Elsevier Patient Education  2020 [employer]

## 2019-06-10 LAB — TESTT+TESTF+SHBG
Sex Hormone Binding: 41.9 nmol/L (ref 24.6–122.0)
Testosterone, Free: 3.1 pg/mL (ref 0.0–4.2)
Testosterone, Total, LC/MS: 31.6 ng/dL (ref 10.0–55.0)

## 2019-06-10 LAB — BASIC METABOLIC PANEL
BUN/Creatinine Ratio: 8 — ABNORMAL LOW (ref 9–23)
BUN: 7 mg/dL (ref 6–20)
CO2: 21 mmol/L (ref 20–29)
Calcium: 9.4 mg/dL (ref 8.7–10.2)
Chloride: 101 mmol/L (ref 96–106)
Creatinine, Ser: 0.84 mg/dL (ref 0.57–1.00)
GFR calc Af Amer: 101 mL/min/{1.73_m2} (ref >59)
GFR calc non Af Amer: 88 mL/min/{1.73_m2} (ref >59)
Glucose: 94 mg/dL (ref 65–99)
Potassium: 4.1 mmol/L (ref 3.5–5.2)
Sodium: 138 mmol/L (ref 134–144)

## 2019-06-10 LAB — CBC
Hematocrit: 42.2 % (ref 34.0–46.6)
Hemoglobin: 14.9 g/dL (ref 11.1–15.9)
MCH: 33 pg (ref 26.6–33.0)
MCHC: 35.3 g/dL (ref 31.5–35.7)
MCV: 94 fL (ref 79–97)
Platelets: 329 10*3/uL (ref 150–450)
RBC: 4.51 x10E6/uL (ref 3.77–5.28)
RDW: 12.7 % (ref 11.7–15.4)
WBC: 7.3 10*3/uL (ref 3.4–10.8)

## 2019-06-10 LAB — HEMOGLOBIN A1C
Est. average glucose Bld gHb Est-mCnc: 114 mg/dL
Hgb A1c MFr Bld: 5.6 % (ref 4.8–5.6)

## 2019-08-23 IMAGING — CT CT CERVICAL SPINE W/O CM
4 of 10 series · 8 of 33 positions shown, 9 images · non-contrast
Comparison: None.

CLINICAL DATA: 37-year-old female with maxillofacial trauma.

EXAM:
CT HEAD WITHOUT CONTRAST
CT MAXILLOFACIAL WITHOUT CONTRAST
CT CERVICAL SPINE WITHOUT CONTRAST
TECHNIQUE: Multidetector CT imaging of the head, cervical spine, and
maxillofacial structures were performed using the standard protocol
without intravenous contrast. Multiplanar CT image reconstructions
of the cervical spine and maxillofacial structures were also
generated.

[Series 9: c spine soft · axial · 0.33mm/px · z∈[-404,-336]mm · 2 of 102 slices shown]
[im 34/102  soft-tissue]
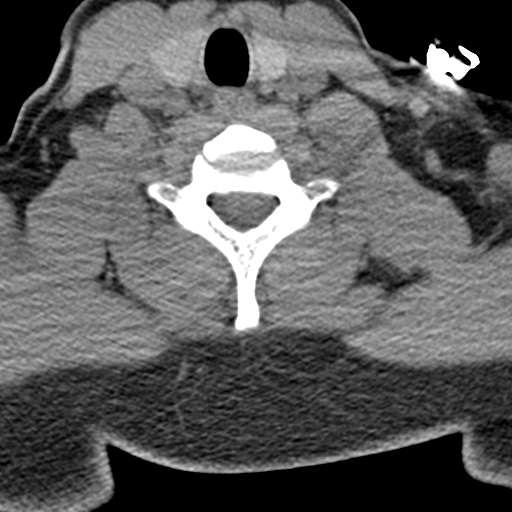
[im 68/102  soft-tissue]
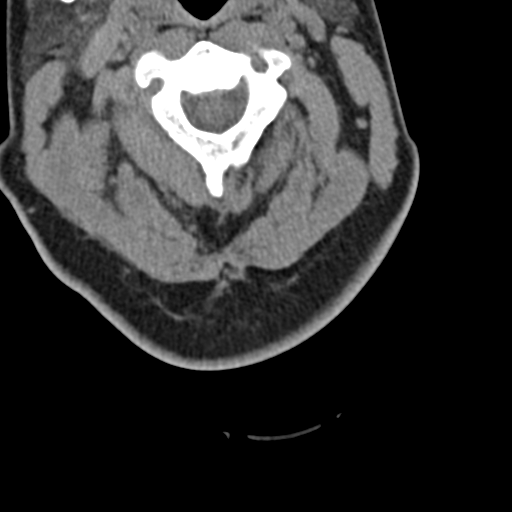

[Series 11: orthogonal axials · axial · 0.23mm/px · z∈[-454,-346]mm · 3 of 122 slices shown, 4 images]
[im 31/122  soft-tissue]
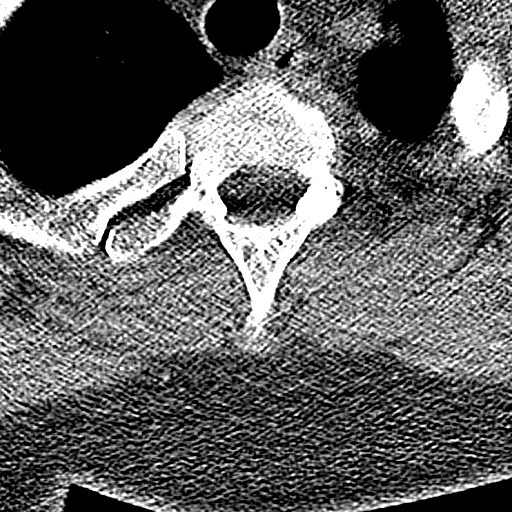
[im 31/122  bone]
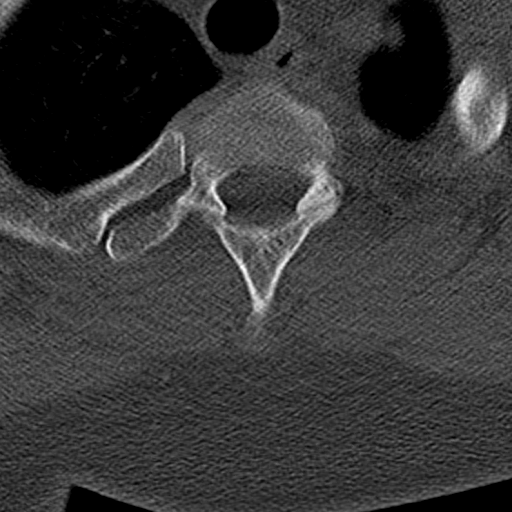
[im 61/122  bone]
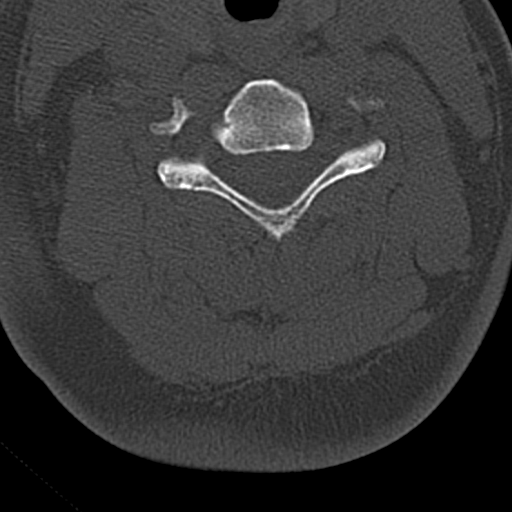
[im 91/122  bone]
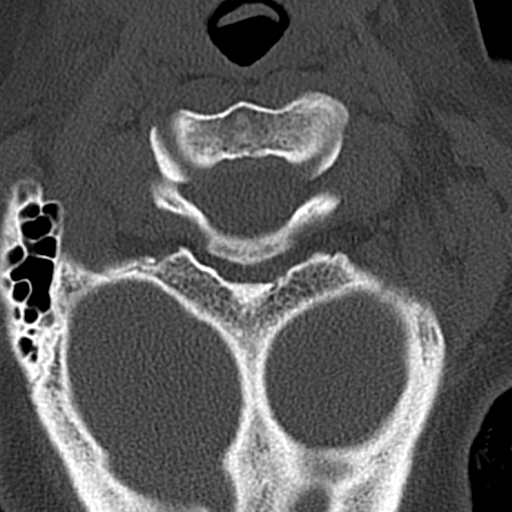

[Series 13: sagittal bone · sagittal · 0.23mm/px · 2 of 61 slices shown]
[im 21/61  bone]
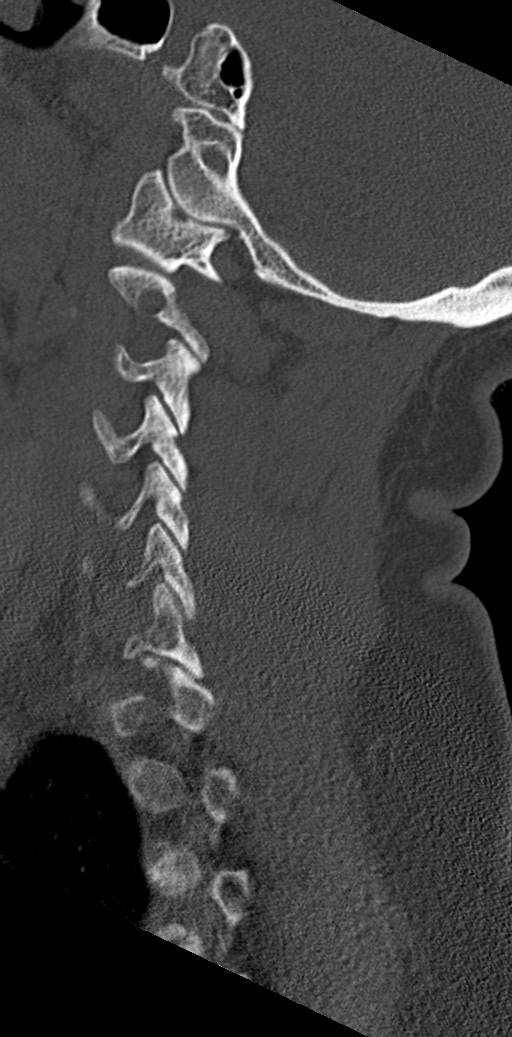
[im 41/61  bone]
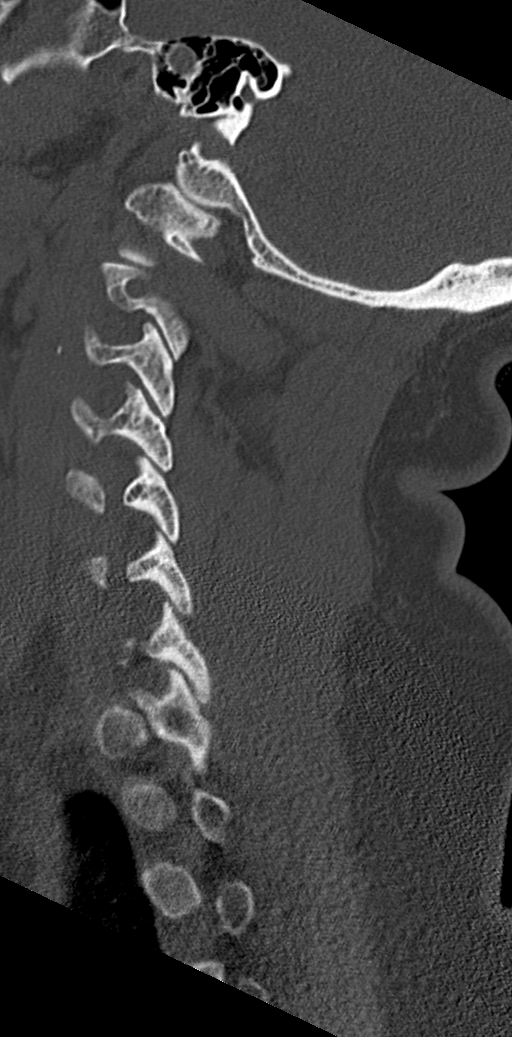

[Series 18: coronal soft · coronal · 0.35mm/px · 1 of 87 slices shown]
[im 44/87  bone]
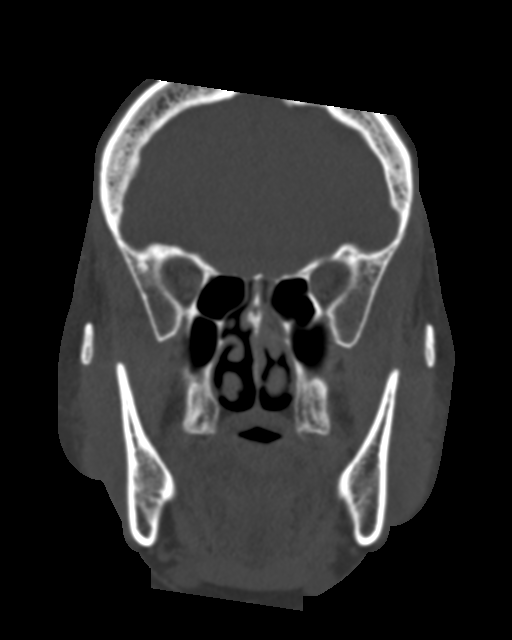

[8 of 33 positions shown; findings below may reference images not displayed]

FINDINGS: CT HEAD FINDINGS

Brain: The ventricles and sulci appropriate size for patient's age.
The gray-white matter discrimination is preserved. There is no acute
intracranial hemorrhage. No mass effect or midline shift. No
extra-axial fluid collection.

Vascular: No hyperdense vessel or unexpected calcification.

Skull: Normal. Negative for fracture or focal lesion.

Other: None

CT MAXILLOFACIAL FINDINGS

Osseous: Faint linear lucency through the right zygomatic arch
(series 15, image [DATE] represent a suture or a nondisplaced
fracture. Correlation with clinical exam and point tenderness
recommended. No other acute fracture identified. No mandibular
dislocation.

Orbits: Negative. No traumatic or inflammatory finding.

Sinuses: Clear.

Soft tissues: Negative.

CT CERVICAL SPINE FINDINGS

Alignment: Normal.

Skull base and vertebrae: No acute fracture. No primary bone lesion
or focal pathologic process.

Soft tissues and spinal canal: No prevertebral fluid or swelling. No
visible canal hematoma.

Disc levels:  No acute findings.

Upper chest: Negative.

Other: None
IMPRESSION: 1. Normal noncontrast CT of the brain.
2. No acute/traumatic cervical spine pathology.
3. Suture versus less likely a nondisplaced fracture of the right
zygomatic arch. Correlation with clinical exam and point tenderness
recommended.

## 2020-09-19 ENCOUNTER — Encounter (HOSPITAL_COMMUNITY): Payer: Self-pay | Admitting: Emergency Medicine

## 2020-09-19 ENCOUNTER — Emergency Department (HOSPITAL_COMMUNITY)
Admission: EM | Admit: 2020-09-19 | Discharge: 2020-09-19 | Disposition: A | Discharge status: Home / Self Care | Payer: 59 | Attending: Emergency Medicine | Admitting: Emergency Medicine

## 2020-09-19 ENCOUNTER — Other Ambulatory Visit: Payer: Self-pay

## 2020-09-19 ENCOUNTER — Emergency Department (HOSPITAL_COMMUNITY): Payer: 59

## 2020-09-19 DIAGNOSIS — R519 Headache, unspecified: Secondary | ICD-10-CM | POA: Insufficient documentation

## 2020-09-19 DIAGNOSIS — R531 Weakness: Secondary | ICD-10-CM | POA: Diagnosis present

## 2020-09-19 DIAGNOSIS — Z7984 Long term (current) use of oral hypoglycemic drugs: Secondary | ICD-10-CM | POA: Diagnosis not present

## 2020-09-19 DIAGNOSIS — E1140 Type 2 diabetes mellitus with diabetic neuropathy, unspecified: Secondary | ICD-10-CM | POA: Insufficient documentation

## 2020-09-19 DIAGNOSIS — F17210 Nicotine dependence, cigarettes, uncomplicated: Secondary | ICD-10-CM | POA: Insufficient documentation

## 2020-09-19 DIAGNOSIS — E119 Type 2 diabetes mellitus without complications: Secondary | ICD-10-CM

## 2020-09-19 DIAGNOSIS — G629 Polyneuropathy, unspecified: Secondary | ICD-10-CM

## 2020-09-19 HISTORY — DX: Type 2 diabetes mellitus without complications: E11.9

## 2020-09-19 LAB — URINALYSIS, ROUTINE W REFLEX MICROSCOPIC
Bilirubin Urine: NEGATIVE
Glucose, UA: >=500 mg/dL — AB
Hgb urine dipstick: NEGATIVE
Ketones, ur: 80 mg/dL — AB
Leukocytes,Ua: NEGATIVE
Nitrite: NEGATIVE
Protein, ur: 30 mg/dL — AB
Specific Gravity, Urine: 1.03 (ref 1.005–1.030)
pH: 5 (ref 5.0–8.0)

## 2020-09-19 LAB — I-STAT VENOUS BLOOD GAS, ED
Acid-base deficit: 7 mmol/L — ABNORMAL HIGH (ref 0.0–2.0)
Bicarbonate: 17.6 mmol/L — ABNORMAL LOW (ref 20.0–28.0)
Calcium, Ion: 1.19 mmol/L (ref 1.15–1.40)
HCT: 40 % (ref 36.0–46.0)
Hemoglobin: 13.6 g/dL (ref 12.0–15.0)
O2 Saturation: 100 %
Potassium: 3.8 mmol/L (ref 3.5–5.1)
Sodium: 138 mmol/L (ref 135–145)
TCO2: 19 mmol/L — ABNORMAL LOW (ref 22–32)
pCO2, Ven: 32.6 mmHg — ABNORMAL LOW (ref 44.0–60.0)
pH, Ven: 7.341 (ref 7.250–7.430)
pO2, Ven: 194 mmHg — ABNORMAL HIGH (ref 32.0–45.0)

## 2020-09-19 LAB — CBC
HCT: 43.2 % (ref 36.0–46.0)
Hemoglobin: 15.3 g/dL — ABNORMAL HIGH (ref 12.0–15.0)
MCH: 32.3 pg (ref 26.0–34.0)
MCHC: 35.4 g/dL (ref 30.0–36.0)
MCV: 91.1 fL (ref 80.0–100.0)
Platelets: 411 10*3/uL — ABNORMAL HIGH (ref 150–400)
RBC: 4.74 MIL/uL (ref 3.87–5.11)
RDW: 12.1 % (ref 11.5–15.5)
WBC: 8.2 10*3/uL (ref 4.0–10.5)
nRBC: 0 % (ref 0.0–0.2)

## 2020-09-19 LAB — BASIC METABOLIC PANEL
Anion gap: 15 (ref 5–15)
Anion gap: 12 (ref 5–15)
BUN: <5 mg/dL — ABNORMAL LOW (ref 6–20)
BUN: <5 mg/dL — ABNORMAL LOW (ref 6–20)
CO2: 17 mmol/L — ABNORMAL LOW (ref 22–32)
CO2: 16 mmol/L — ABNORMAL LOW (ref 22–32)
Calcium: 8.9 mg/dL (ref 8.9–10.3)
Calcium: 8 mg/dL — ABNORMAL LOW (ref 8.9–10.3)
Chloride: 104 mmol/L (ref 98–111)
Chloride: 109 mmol/L (ref 98–111)
Creatinine, Ser: 0.96 mg/dL (ref 0.44–1.00)
Creatinine, Ser: 0.75 mg/dL (ref 0.44–1.00)
GFR, Estimated: >60 mL/min (ref >60)
GFR, Estimated: >60 mL/min (ref >60)
Glucose, Bld: 282 mg/dL — ABNORMAL HIGH (ref 70–99)
Glucose, Bld: 222 mg/dL — ABNORMAL HIGH (ref 70–99)
Potassium: 3.7 mmol/L (ref 3.5–5.1)
Potassium: 3.7 mmol/L (ref 3.5–5.1)
Sodium: 136 mmol/L (ref 135–145)
Sodium: 137 mmol/L (ref 135–145)

## 2020-09-19 LAB — CBG MONITORING, ED: Glucose-Capillary: 280 mg/dL — ABNORMAL HIGH (ref 70–99)

## 2020-09-19 MED ORDER — SODIUM CHLORIDE 0.9 % IV BOLUS
1000.0000 mL | Freq: Once | INTRAVENOUS | Status: AC
  Administered 2020-09-19: 1000 mL via INTRAVENOUS

## 2020-09-19 MED ORDER — METFORMIN HCL ER 500 MG PO TB24: 500.0000 mg | ORAL_TABLET | Freq: Every day | ORAL | 0 refills | Status: DC

## 2020-09-19 NOTE — Progress Notes (Signed)
   09/19/20 1134  TOC ED Mini Assessment  TOC Time spent with patient (minutes): 25  PING Used in TOC Assessment No  Admission or Readmission Diverted Yes  Interventions which prevented an admission or readmission Follow-up medical appointment;Medication Review  What brought you to the Emergency Department?  headache, numbness, Diabetic Symptoms  ED RNCM received consult to assist with care coordination for a new onset diabetic. ED RNCM spoke with patient, she reports having health insurance [employer] without a PCP.  Patient requested assistance with finding a PCP, discussed [facility] near patient's residence,she is agreeable. Also discussed [facility]. Provided information on AVS for follow up patient verbalized understanding.  Updated EDP,No barriers to discharge noted

## 2020-09-19 NOTE — ED Triage Notes (Addendum)
Pt reports numbness to bilateral feet since yesterday.  Reports headache yesterday that has resolved.  No arm drift.  States she has had generalized weakness, dizziness, and excessive thirst.  States she has been drinking a lot of water and gatorade.  Reports 24 lb unintentional weight loss in the last 2 weeks. Blood sugar 280.  No previous history of diabetes (family history).

## 2020-09-19 NOTE — ED Provider Notes (Signed)
[facility] Provider Note   CSN: [id number] Arrival date & time: 09/19/20  [id number]     History Chief Complaint  Patient presents with  . Headache  . Numbness    Julie Coffey is a 39 y.o. female who presents to the ED today with complaint of generalized weakness/fatigue, increased thirst, and an unintentional weight loss of about 24 pounds in the past 2 weeks. Pt also states that she began noticing a tingling sensation to the toes on her left foot yesterday and woke up this morning with the same sensation in her right pinky toe. She mentions that on Thanksgiving (3 days ago) she felt like she couldn't get her words out appropriately and her mom kept telling her she seemed tired and needed to rest. She states she is here today with concern over all of these symptoms. Pt denies any history of diabetes despite her chart stating PMHx of diabetes. She states a lot of members in her family have diabetes as they carry their weight in their abdomen. Pt reports that she did have a physical done sometime this year or late last year however is not sure they have ever screened her for diabetes. She does not currently have a PCP.   Per chart review: A1c 06/06/2019 at 5.6.   Pt denies fevers, chills, double vision, slurring of speech, facial droop, unilateral weakness/numbness, confusion, or any other associated symptoms.   The history is provided by the patient and medical records.       Past Medical History:  Diagnosis Date  . Diabetes mellitus without complication (HCC)   . HSV infection     There are no problems to display for this patient.   Past Surgical History:  Procedure Laterality Date  . ABDOMINAL HYSTERECTOMY    . CESAREAN SECTION     x3  . TERATOMA EXCISION       OB History   No obstetric history on file.     No family history on file.  Social History   Tobacco Use  . Smoking status: Current Every Day Smoker    Packs/day:  0.50    Types: Cigarettes  . Smokeless tobacco: Never Used  . Tobacco comment: took chantix so smokes 1-2 cigs/day  Vaping Use  . Vaping Use: Never used  Substance Use Topics  . Alcohol use: Yes    Comment: occasionally  . Drug use: Yes    Types: Marijuana    Home Medications Prior to Admission medications   Medication Sig Start Date End Date Taking? Authorizing Provider  cephALEXin (KEFLEX) 500 MG capsule Take 500 mg by mouth 2 (two) times daily.    [provider]  chlorhexidine (HIBICLENS) 4 % external liquid Apply topically daily as needed. 06/06/19   Doristine BosworthStallings, Zoe A, MD  clindamycin (CLEOCIN-T) 1 % lotion Apply topically 2 (two) times daily. 06/06/19   Doristine BosworthStallings, Zoe A, MD  fluconazole (DIFLUCAN) 150 MG tablet Take one tablet for vaginal irritation then repeat second dose in 3 days. 06/06/19   Doristine BosworthStallings, Zoe A, MD  ibuprofen (ADVIL,MOTRIN) 200 MG tablet Take 200 mg by mouth every 6 (six) hours as needed for moderate pain.    [provider]  metFORMIN (GLUCOPHAGE-XR) 500 MG 24 hr tablet Take 1 tablet (500 mg total) by mouth daily with breakfast. 09/19/20 10/19/20  Tanda RockersVenter, Melena Hayes, PA-C  nicotine polacrilex (NICORETTE) 2 MG gum Take 1 each (2 mg total) by mouth as needed for smoking cessation. Patient not taking:  Reported on 06/06/2019 08/19/18   Levie Heritage, DO  nystatin (MYCOSTATIN/NYSTOP) powder Apply topically 4 (four) times daily. Patient not taking: Reported on 06/06/2019 08/19/18   Levie Heritage, DO    Allergies    Patient has no known allergies.  Review of Systems   Review of Systems  Constitutional: Positive for fatigue. Negative for chills and fever.  Eyes: Positive for visual disturbance (blurry x several months).  Respiratory: Negative for cough and shortness of breath.   Cardiovascular: Negative for chest pain.  Gastrointestinal: Negative for abdominal pain, nausea and vomiting.  Endocrine: Positive for polydipsia.  Neurological: Positive  for speech difficulty and headaches. Negative for weakness and numbness.  All other systems reviewed and are negative.   Physical Exam Updated Vital Signs BP (!) 152/97 (BP Location: Left Arm)   Pulse (!) 127   Temp (!) 97.5 F (36.4 C) (Oral)   Resp 18   Ht 5\' 8"  (1.727 m)   Wt 108.9 kg   SpO2 97%   BMI 36.49 kg/m   Physical Exam Vitals and nursing note reviewed.  Constitutional:      Appearance: She is obese. She is not ill-appearing or diaphoretic.  HENT:     Head: Normocephalic and atraumatic.  Eyes:     Extraocular Movements: Extraocular movements intact.     Conjunctiva/sclera: Conjunctivae normal.     Pupils: Pupils are equal, round, and reactive to light.  Neck:     Meningeal: Brudzinski's sign and Kernig's sign absent.  Cardiovascular:     Rate and Rhythm: Normal rate and regular rhythm.     Heart sounds: Normal heart sounds.  Pulmonary:     Effort: Pulmonary effort is normal.     Breath sounds: Normal breath sounds. No wheezing, rhonchi or rales.  Abdominal:     Palpations: Abdomen is soft.     Tenderness: There is no abdominal tenderness.  Musculoskeletal:     Cervical back: Neck supple.  Skin:    General: Skin is warm and dry.  Neurological:     Mental Status: She is alert.     Comments: Alert and oriented to self, place, time and event.   Speech is fluent, clear without dysarthria or dysphasia.   Strength 5/5 in upper/lower extremities  Sensation intact in upper/lower extremities however pt reports a decreased sensation to her right 5th toe and all toes on the left foot.   Normal gait.  Negative Romberg. No pronator drift.  Normal finger-to-nose and feet tapping.  CN I not tested  CN II grossly intact visual fields bilaterally. Did not visualize posterior eye.   CN III, IV, VI PERRLA and EOMs intact bilaterally  CN V Intact sensation to sharp and light touch to the face  CN VII facial movements symmetric  CN VIII not tested  CN IX, X no uvula  deviation, symmetric rise of soft palate  CN XI 5/5 SCM and trapezius strength bilaterally  CN XII Midline tongue protrusion, symmetric L/R movements      ED Results / Procedures / Treatments   Labs (all labs ordered are listed, but only abnormal results are displayed) Labs Reviewed  BASIC METABOLIC PANEL - Abnormal; Notable for the following components:      Result Value   CO2 17 (*)    Glucose, Bld 282 (*)    BUN <5 (*)    All other components within normal limits  CBC - Abnormal; Notable for the following components:   Hemoglobin 15.3 (*)  Platelets 411 (*)    All other components within normal limits  URINALYSIS, ROUTINE W REFLEX MICROSCOPIC - Abnormal; Notable for the following components:   APPearance HAZY (*)    Glucose, UA >=500 (*)    Ketones, ur 80 (*)    Protein, ur 30 (*)    Bacteria, UA RARE (*)    All other components within normal limits  BASIC METABOLIC PANEL - Abnormal; Notable for the following components:   CO2 16 (*)    Glucose, Bld 222 (*)    BUN <5 (*)    Calcium 8.0 (*)    All other components within normal limits  CBG MONITORING, ED - Abnormal; Notable for the following components:   Glucose-Capillary 280 (*)    All other components within normal limits  I-STAT VENOUS BLOOD GAS, ED - Abnormal; Notable for the following components:   pCO2, Ven 32.6 (*)    pO2, Ven 194.0 (*)    Bicarbonate 17.6 (*)    TCO2 19 (*)    Acid-base deficit 7.0 (*)    All other components within normal limits    EKG EKG Interpretation  Date/Time:  Sunday September 19 2020 09:14:49 EST Ventricular Rate:  126 PR Interval:  138 QRS Duration: 72 QT Interval:  316 QTC Calculation: 457 R Axis:   21 Text Interpretation: Sinus tachycardia Otherwise normal ECG Since last tracing rate faster Confirmed by Haviland, Julie ([id number]) on 09/19/2020 10:46:47 AM   Radiology CT Head Wo Contrast  Result Date: 09/19/2020 CLINICAL DATA:  Mental status changes.  Headache and  numbness. EXAM: CT HEAD WITHOUT CONTRAST TECHNIQUE: Contiguous axial images were obtained from the base of the skull through the vertex without intravenous contrast. COMPARISON:  06/22/2018 FINDINGS: Brain: No evidence of acute infarction, hemorrhage, hydrocephalus, extra-axial collection or mass lesion/mass effect. Vascular: No hyperdense vessel or unexpected calcification. Skull: Normal. Negative for fracture or focal lesion. Sinuses/Orbits: Retention cyst versus polyp noted in the left side of the maxillary sinus. The remaining paranasal sinuses are clear. Other: None. IMPRESSION: 1. No acute intracranial abnormalities. 2. Left maxillary sinus retention cyst versus polyp. Electronically Signed   By: Taylor  Stroud M.D.   On: 09/19/2020 11:11    Procedures Procedures (including critical care time)  Medications Ordered in ED Medications  sodium chloride 0.9 % bolus 1,000 mL (0 mLs Intravenous Stopped 09/19/20 1300)    ED Course  I have reviewed the triage vital signs and the nursing notes.  Pertinent labs & imaging results that were available during my care of the patient were reviewed by me and considered in my medical decision making (see chart for details).    MDM Rules/Calculators/A&P                          39  year old female who presents to the ED with multiple complaints.  Reports that she has increased thirst, unintentional weight loss of 24 pounds, tingling in her toes for the past 2 weeks.  No history of diabetes.  Also reports she had some difficulty getting her words out 3 days ago while at Thanksgiving however this resolved.  On arrival to the ED vitals include a temperature of 97.5, tachycardia at 127, nontachypneic.  Satting 97% on room air.  While patient is in the room her heart rate has decreased into the 80s.  A CBG was obtained will patient was in the waiting room with a glucose of 280.  It does appear she had  an A1c checked in August of last year at 5.6 however does not  have a PCP and did not follow-up for repeat.  Has not attempted weight loss or change in diet.  On exam patient has no focal neuro deficits today.  She states she has some decreased sensation in her right pinky toe and all toes on her left foot, suspect neuropathy at this time however given her complaint of inability to get her words out Thursday will obtain a CT head.  I suspect that this is likely due to elevated blood sugar.  We will plan for lab work, fluids, VBG to assess for any type of DKA today.  An EKG was obtained which does not show any acute ischemic changes.  Will have transition of care evaluate patient and she does not currently have a PCP.  We will plan to discharge home with 500 mg Metformin on a daily basis to start.   VBG with normal pH at 7.341. Bicarb 17.6 and PCO2 32.6. Not consistent with DKA. Will plan to repeat BMP after fluids however to ensure gap is closing.  U/A with > 500 glucose. No signs of infection CT head negative  Repeat BMP with bicarb of 16 however gap smaller at 12. Pt reports improvement in symptoms. We had a lengthy discussion about new onset diabetes and diet, exercise, and lifestyle modification. She reports she used to be very active prior to COVID however given she is now working from home she is making worse food choices and eating treats consistently. She is very serious about lifestyle modification at this time. TOC has come to see patient - will have her follow up with [facility] however will need medication in the meantime. Pt to be discharged home with metformin at this time. She is instructed on strict return precautions and agrees with plan.   This note was prepared using Dragon voice recognition software and may include unintentional dictation errors due to the inherent limitations of voice recognition software.   Final Clinical Impression(s) / ED Diagnoses Final diagnoses:  Diabetes mellitus, new onset (HCC)  Neuropathy    Rx / DC  Orders ED Discharge Orders         Ordered    metFORMIN (GLUCOPHAGE-XR) 500 MG 24 hr tablet  Daily with breakfast        09/19/20 1358           Discharge Instructions     Please pick up medication and take as prescribed Follow up with [facility] to establish care. They will take over your prescription.  You can also follow up with [facility] to help manage your diabetes Attached are additional resources on diabetes and lifestyle modifications  Return to the ED IMMEDIATELY for any worsening symptoms including vomiting, shortness of breath, passing out, or any other new/concerning symptoms       Tanda Rockers, PA-C 09/19/20 1401    Jacalyn Lefevre, MD 09/19/20 1519

## 2020-09-19 NOTE — Discharge Instructions (Addendum)
Please pick up medication and take as prescribed Follow up with [facility] at [facility] to establish care. They will take over your prescription.  You can also follow up with [facility] to help manage your diabetes Attached are additional resources on diabetes and lifestyle modifications  Return to the ED IMMEDIATELY for any worsening symptoms including vomiting, shortness of breath, passing out, or any other new/concerning symptoms

## 2020-12-15 ENCOUNTER — Emergency Department (HOSPITAL_BASED_OUTPATIENT_CLINIC_OR_DEPARTMENT_OTHER)
Admission: EM | Admit: 2020-12-15 | Discharge: 2020-12-15 | Disposition: A | Discharge status: Home / Self Care | Payer: BC Managed Care – PPO | Attending: Emergency Medicine | Admitting: Emergency Medicine

## 2020-12-15 ENCOUNTER — Other Ambulatory Visit: Payer: Self-pay

## 2020-12-15 ENCOUNTER — Emergency Department (HOSPITAL_BASED_OUTPATIENT_CLINIC_OR_DEPARTMENT_OTHER): Payer: BC Managed Care – PPO

## 2020-12-15 ENCOUNTER — Encounter (HOSPITAL_BASED_OUTPATIENT_CLINIC_OR_DEPARTMENT_OTHER): Payer: Self-pay | Admitting: *Deleted

## 2020-12-15 DIAGNOSIS — Z7984 Long term (current) use of oral hypoglycemic drugs: Secondary | ICD-10-CM | POA: Diagnosis not present

## 2020-12-15 DIAGNOSIS — E119 Type 2 diabetes mellitus without complications: Secondary | ICD-10-CM | POA: Insufficient documentation

## 2020-12-15 DIAGNOSIS — Z87891 Personal history of nicotine dependence: Secondary | ICD-10-CM | POA: Diagnosis not present

## 2020-12-15 DIAGNOSIS — Y9321 Activity, ice skating: Secondary | ICD-10-CM | POA: Insufficient documentation

## 2020-12-15 DIAGNOSIS — S3992XA Unspecified injury of lower back, initial encounter: Secondary | ICD-10-CM | POA: Diagnosis present

## 2020-12-15 DIAGNOSIS — S300XXA Contusion of lower back and pelvis, initial encounter: Secondary | ICD-10-CM | POA: Diagnosis not present

## 2020-12-15 DIAGNOSIS — R32 Unspecified urinary incontinence: Secondary | ICD-10-CM | POA: Diagnosis not present

## 2020-12-15 LAB — URINALYSIS, ROUTINE W REFLEX MICROSCOPIC
Bilirubin Urine: NEGATIVE
Glucose, UA: 250 mg/dL — AB
Hgb urine dipstick: NEGATIVE
Ketones, ur: NEGATIVE mg/dL
Leukocytes,Ua: NEGATIVE
Nitrite: NEGATIVE
Protein, ur: NEGATIVE mg/dL
Specific Gravity, Urine: 1.02 (ref 1.005–1.030)
pH: 6.5 (ref 5.0–8.0)

## 2020-12-15 LAB — PREGNANCY, URINE: Preg Test, Ur: NEGATIVE

## 2020-12-15 MED ORDER — HYDROCODONE-ACETAMINOPHEN 5-325 MG PO TABS
1.0000 | ORAL_TABLET | Freq: Four times a day (QID) | ORAL | 0 refills | Status: DC | PRN
Start: 2020-12-15 — End: 2022-09-03

## 2020-12-15 MED ORDER — IBUPROFEN 600 MG PO TABS: 600.0000 mg | ORAL_TABLET | Freq: Four times a day (QID) | ORAL | 0 refills | Status: DC | PRN

## 2020-12-15 NOTE — ED Triage Notes (Signed)
C/o tailbone injury x 4 days ago
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
## 2020-12-15 NOTE — Discharge Instructions (Addendum)
Try using a donut pillow for comfort.  Icing the area may also help with discomfort.  Take the pain medications as needed.  Hydrocodone is a narcotic medication.  Take that as needed for severe pain when you are trying to rest.  Follow up with your orthopedic doctor if the symptoms are not improving in the next week

## 2021-06-12 ENCOUNTER — Emergency Department (HOSPITAL_BASED_OUTPATIENT_CLINIC_OR_DEPARTMENT_OTHER)
Admission: EM | Admit: 2021-06-12 | Discharge: 2021-06-12 | Disposition: A | Discharge status: Home / Self Care | Payer: BC Managed Care – PPO | Attending: Emergency Medicine | Admitting: Emergency Medicine

## 2021-06-12 ENCOUNTER — Other Ambulatory Visit: Payer: Self-pay

## 2021-06-12 DIAGNOSIS — E119 Type 2 diabetes mellitus without complications: Secondary | ICD-10-CM | POA: Diagnosis not present

## 2021-06-12 DIAGNOSIS — Z87891 Personal history of nicotine dependence: Secondary | ICD-10-CM | POA: Diagnosis not present

## 2021-06-12 DIAGNOSIS — Z7984 Long term (current) use of oral hypoglycemic drugs: Secondary | ICD-10-CM | POA: Insufficient documentation

## 2021-06-12 DIAGNOSIS — T7840XA Allergy, unspecified, initial encounter: Secondary | ICD-10-CM | POA: Insufficient documentation

## 2021-06-12 LAB — URINALYSIS, ROUTINE W REFLEX MICROSCOPIC
Bilirubin Urine: NEGATIVE
Glucose, UA: NEGATIVE mg/dL
Hgb urine dipstick: NEGATIVE
Ketones, ur: NEGATIVE mg/dL
Leukocytes,Ua: NEGATIVE
Nitrite: NEGATIVE
Protein, ur: NEGATIVE mg/dL
Specific Gravity, Urine: 1.025 (ref 1.005–1.030)
pH: 6 (ref 5.0–8.0)

## 2021-06-12 MED ORDER — PREDNISONE 20 MG PO TABS: 40.0000 mg | ORAL_TABLET | Freq: Every day | ORAL | 0 refills | Status: DC

## 2021-06-12 NOTE — ED Provider Notes (Signed)
[facility] EMERGENCY DEPARTMENT Provider Note   CSN: [id number] Arrival date & time: 06/12/21  6948     History Chief Complaint  Patient presents with   Rash    Julie Coffey is a 40 y.o. female.  The history is provided by the patient.  Rash Location:  Full body Quality: itchiness   Severity:  Severe Onset quality:  Gradual Duration:  3 days Timing:  Constant Progression:  Worsening Chronicity:  New Context comment:  Patient is not sure what she could be reacting to.  She thinks may be something in the water because she has not changed any shampoos, soap, lotion, laundry detergent Relieved by:  Nothing Worsened by:  Nothing Ineffective treatments:  Topical steroids Associated symptoms: no abdominal pain, no fever, no headaches, no hoarse voice, no joint pain, no myalgias, no nausea, no shortness of breath, no throat swelling, no tongue swelling, no URI, not vomiting and not wheezing       Past Medical History:  Diagnosis Date   Diabetes mellitus without complication (HCC)    HSV infection     There are no problems to display for this patient.   Past Surgical History:  Procedure Laterality Date   ABDOMINAL HYSTERECTOMY     CESAREAN SECTION     x3   TERATOMA EXCISION       OB History   No obstetric history on file.     No family history on file.  Social History   Tobacco Use   Smoking status: Former    Packs/day: 0.50    Types: Cigarettes   Smokeless tobacco: Never   Tobacco comments:    took chantix so smokes 1-2 cigs/day  Vaping Use   Vaping Use: Never used  Substance Use Topics   Alcohol use: Yes    Comment: occasionally   Drug use: Yes    Types: Marijuana    Home Medications Prior to Admission medications   Medication Sig Start Date End Date Taking? Authorizing Provider  predniSONE (DELTASONE) 20 MG tablet Take 2 tablets (40 mg total) by mouth daily. 06/12/21  Yes Kilian Schwartz, Alphonzo Lemmings, MD  cephALEXin (KEFLEX) 500 MG capsule  Take 500 mg by mouth 2 (two) times daily.    [provider]  chlorhexidine (HIBICLENS) 4 % external liquid Apply topically daily as needed. 06/06/19   Doristine Bosworth, MD  clindamycin (CLEOCIN-T) 1 % lotion Apply topically 2 (two) times daily. 06/06/19   Doristine Bosworth, MD  fluconazole (DIFLUCAN) 150 MG tablet Take one tablet for vaginal irritation then repeat second dose in 3 days. 06/06/19   Doristine Bosworth, MD  HYDROcodone-acetaminophen (NORCO/VICODIN) 5-325 MG tablet Take 1 tablet by mouth every 6 (six) hours as needed. 12/15/20   Linwood Dibbles, MD  ibuprofen (ADVIL) 600 MG tablet Take 1 tablet (600 mg total) by mouth every 6 (six) hours as needed. 12/15/20   Linwood Dibbles, MD  metFORMIN (GLUCOPHAGE-XR) 500 MG 24 hr tablet Take 1 tablet (500 mg total) by mouth daily with breakfast. 09/19/20 10/19/20  Tanda Rockers, PA-C  nicotine polacrilex (NICORETTE) 2 MG gum Take 1 each (2 mg total) by mouth as needed for smoking cessation. Patient not taking: Reported on 06/06/2019 08/19/18   Levie Heritage, DO  nystatin (MYCOSTATIN/NYSTOP) powder Apply topically 4 (four) times daily. Patient not taking: Reported on 06/06/2019 08/19/18   Levie Heritage, DO    Allergies    Patient has no known allergies.  Review of Systems   Review  of Systems  Constitutional:  Negative for fever.  HENT:  Negative for hoarse voice.   Respiratory:  Negative for shortness of breath and wheezing.   Gastrointestinal:  Negative for abdominal pain, nausea and vomiting.  Musculoskeletal:  Negative for arthralgias and myalgias.  Skin:  Positive for rash.  Neurological:  Negative for headaches.  All other systems reviewed and are negative.  Physical Exam Updated Vital Signs BP (!) 145/94 (BP Location: Right Arm)   Pulse 78   Temp 97.8 F (36.6 C) (Oral)   Resp 18   Ht 5\' 8"  (1.727 m)   Wt 108.9 kg   SpO2 99%   BMI 36.50 kg/m   Physical Exam Vitals and nursing note reviewed.  Constitutional:       General: She is not in acute distress.    Appearance: Normal appearance. She is normal weight.  HENT:     Head: Normocephalic and atraumatic.  Eyes:     Pupils: Pupils are equal, round, and reactive to light.  Cardiovascular:     Rate and Rhythm: Normal rate.     Pulses: Normal pulses.  Pulmonary:     Effort: Pulmonary effort is normal.  Musculoskeletal:        General: Normal range of motion.     Cervical back: Normal range of motion and neck supple.     Right lower leg: No edema.     Left lower leg: No edema.  Skin:    General: Skin is warm.     Capillary Refill: Capillary refill takes less than 2 seconds.     Findings: Rash present.     Comments: Confluent papular rash involving the entire trunk including abdomen chest and back, neck, face and more sparse and patchy over the bilateral upper and lower extremities.  No significant erythema.  No vesicles, pustules or weeping or drainage.  Mild excoriation over the back.  Neurological:     Mental Status: She is alert and oriented to person, place, and time. Mental status is at baseline.  Psychiatric:        Mood and Affect: Mood normal.        Behavior: Behavior normal.    ED Results / Procedures / Treatments   Labs (all labs ordered are listed, but only abnormal results are displayed) Labs Reviewed  URINALYSIS, ROUTINE W REFLEX MICROSCOPIC    EKG None  Radiology No results found.  Procedures Procedures   Medications Ordered in ED Medications - No data to display  ED Course  I have reviewed the triage vital signs and the nursing notes.  Pertinent labs & imaging results that were available during my care of the patient were reviewed by me and considered in my medical decision making (see chart for details).    MDM Rules/Calculators/A&P                           Patient presenting with symptoms most consistent with an allergic reaction.  It started 3 days ago and is gradually worsened.  It is now involving most  of her body in a fine confluent rash.  She cannot think of anything specific that would have caused this.  No prior history of similar reactions.  She has been trying topical hydrocortisone but it is not working.  She did take Tylenol a few days ago which she normally does not take but cannot think of any other medications.  Unlikely that it is the Tylenol  but instructed her to avoid this at this time.  Patient is diabetic but has very well-controlled sugars.  We will have her start 5 days of prednisone to help with the reaction as well as Benadryl for the itching.  Given her follow-up instructions.  Final Clinical Impression(s) / ED Diagnoses Final diagnoses:  Allergic reaction, initial encounter    Rx / DC Orders ED Discharge Orders          Ordered    predniSONE (DELTASONE) 20 MG tablet  Daily        06/12/21 0717             Gwyneth Sprout, MD 06/12/21 [id number]

## 2021-06-12 NOTE — ED Notes (Signed)
Patient Alert and oriented to baseline. Stable and ambulatory to baseline. Patient verbalized understanding of the discharge instructions.  Patient belongings were taken by the patient.   

## 2021-06-12 NOTE — Discharge Instructions (Addendum)
Avoid anything that could possibly have made you break out.  Also you can use benadryl as needed for the itching.  If this continues to happen to you, you may need to see someone with immunology and allergy.  Your blood sugar will go up while on the prednisone so watch you diet closely

## 2021-06-12 NOTE — ED Triage Notes (Signed)
Reports rash for the last three days to her torso, arms, and vagina.  Also reports burning on urination.

## 2022-09-03 ENCOUNTER — Other Ambulatory Visit: Payer: Self-pay

## 2022-09-03 ENCOUNTER — Emergency Department (HOSPITAL_BASED_OUTPATIENT_CLINIC_OR_DEPARTMENT_OTHER): Payer: BC Managed Care – PPO

## 2022-09-03 ENCOUNTER — Emergency Department (HOSPITAL_BASED_OUTPATIENT_CLINIC_OR_DEPARTMENT_OTHER)
Admission: EM | Admit: 2022-09-03 | Discharge: 2022-09-03 | Disposition: A | Discharge status: Home / Self Care | Payer: BC Managed Care – PPO | Attending: Emergency Medicine | Admitting: Emergency Medicine

## 2022-09-03 ENCOUNTER — Encounter (HOSPITAL_BASED_OUTPATIENT_CLINIC_OR_DEPARTMENT_OTHER): Payer: Self-pay

## 2022-09-03 ENCOUNTER — Emergency Department (HOSPITAL_BASED_OUTPATIENT_CLINIC_OR_DEPARTMENT_OTHER): Payer: BC Managed Care – PPO | Admitting: Radiology

## 2022-09-03 DIAGNOSIS — K0889 Other specified disorders of teeth and supporting structures: Secondary | ICD-10-CM | POA: Insufficient documentation

## 2022-09-03 DIAGNOSIS — Z7984 Long term (current) use of oral hypoglycemic drugs: Secondary | ICD-10-CM | POA: Diagnosis not present

## 2022-09-03 DIAGNOSIS — E119 Type 2 diabetes mellitus without complications: Secondary | ICD-10-CM | POA: Insufficient documentation

## 2022-09-03 DIAGNOSIS — J189 Pneumonia, unspecified organism: Secondary | ICD-10-CM

## 2022-09-03 DIAGNOSIS — J181 Lobar pneumonia, unspecified organism: Secondary | ICD-10-CM | POA: Insufficient documentation

## 2022-09-03 LAB — BASIC METABOLIC PANEL
Anion gap: 10 (ref 5–15)
BUN: 9 mg/dL (ref 6–20)
CO2: 25 mmol/L (ref 22–32)
Calcium: 9.9 mg/dL (ref 8.9–10.3)
Chloride: 105 mmol/L (ref 98–111)
Creatinine, Ser: 0.92 mg/dL (ref 0.44–1.00)
GFR, Estimated: >60 mL/min (ref >60)
Glucose, Bld: 94 mg/dL (ref 70–99)
Potassium: 3.8 mmol/L (ref 3.5–5.1)
Sodium: 140 mmol/L (ref 135–145)

## 2022-09-03 LAB — CBC
HCT: 43.3 % (ref 36.0–46.0)
Hemoglobin: 15.4 g/dL — ABNORMAL HIGH (ref 12.0–15.0)
MCH: 32.3 pg (ref 26.0–34.0)
MCHC: 35.6 g/dL (ref 30.0–36.0)
MCV: 90.8 fL (ref 80.0–100.0)
Platelets: 410 10*3/uL — ABNORMAL HIGH (ref 150–400)
RBC: 4.77 MIL/uL (ref 3.87–5.11)
RDW: 11.7 % (ref 11.5–15.5)
WBC: 7.7 10*3/uL (ref 4.0–10.5)
nRBC: 0 % (ref 0.0–0.2)

## 2022-09-03 LAB — TROPONIN I (HIGH SENSITIVITY)
Troponin I (High Sensitivity): 2 ng/L (ref <18)
Troponin I (High Sensitivity): 2 ng/L (ref <18)

## 2022-09-03 MED ORDER — MORPHINE SULFATE (PF) 4 MG/ML IV SOLN
4.0000 mg | Freq: Once | INTRAVENOUS | Status: AC
  Administered 2022-09-03: 4 mg via INTRAVENOUS
  Filled 2022-09-03: qty 1

## 2022-09-03 MED ORDER — HYDROCODONE-ACETAMINOPHEN 5-325 MG PO TABS: 1.0000 | ORAL_TABLET | Freq: Four times a day (QID) | ORAL | 0 refills | Status: DC | PRN

## 2022-09-03 MED ORDER — AMOXICILLIN-POT CLAVULANATE 875-125 MG PO TABS: 1.0000 | ORAL_TABLET | Freq: Two times a day (BID) | ORAL | 0 refills | Status: DC

## 2022-09-03 MED ORDER — IBUPROFEN 400 MG PO TABS
600.0000 mg | ORAL_TABLET | Freq: Once | ORAL | Status: AC
  Administered 2022-09-03: 600 mg via ORAL
  Filled 2022-09-03: qty 1

## 2022-09-03 MED ORDER — IOHEXOL 300 MG/ML  SOLN
100.0000 mL | Freq: Once | INTRAMUSCULAR | Status: AC | PRN
  Administered 2022-09-03: 75 mL via INTRAVENOUS

## 2022-09-03 MED ORDER — ONDANSETRON HCL 4 MG/2ML IJ SOLN
4.0000 mg | Freq: Once | INTRAMUSCULAR | Status: AC
  Administered 2022-09-03: 4 mg via INTRAVENOUS
  Filled 2022-09-03: qty 2

## 2022-09-03 NOTE — ED Provider Triage Note (Signed)
Emergency Medicine Provider Triage Evaluation Note  Julie Coffey , a 41 y.o. female  was evaluated in triage.  Pt complains of dental pain that has been ongoing since Friday.  She believes she fractured her tooth while eating a granola bar.  States pain is worsening and now radiating down to her chest.  Denies fever, chills.  States her dentist has been closed and has not been able to reach out.  Review of Systems  Positive: As above Negative: As above  Physical Exam  BP (!) 155/107 (BP Location: Right Arm)   Pulse 96   Temp 97.9 F (36.6 C) (Oral)   Resp 18   Ht 5\' 8"  (1.727 m)   Wt 113.4 kg   SpO2 97%   BMI 38.01 kg/m  Gen:   Awake, no distress   Resp:  Normal effort  MSK:   Moves extremities without difficulty  Other:  Without facial swelling.  Without trismus.  Medical Decision Making  Medically screening exam initiated at 11:08 AM.  Appropriate orders placed.  Julie Coffey was informed that the remainder of the evaluation will be completed by another provider, this initial triage assessment does not replace that evaluation, and the importance of remaining in the ED until their evaluation is complete.     Julie Form, PA-C 09/03/22 1109

## 2022-09-03 NOTE — Discharge Instructions (Addendum)
Your work-up today showed that you have pneumonia.  Have sent antibiotic into the pharmacy for you.  Your work-up otherwise was reassuring.  CT scan of the face did not show any concern for abscess.  I did send in some pain medication for you to keep on hand for severe or breakthrough pain.  Take Tylenol and Motrin as needed for pain and reserve the narcotic pain medicine for severe pain.  If you take the narcotic pain medicine do not drive or operate heavy machinery.  For any concerning symptoms please return to the emergency room.  Otherwise follow-up with your primary care provider.  Please schedule a follow-up appointment with your dentist as well.  If you do not have 1 I have given you information to our on-call dentist.

## 2022-09-03 NOTE — ED Provider Notes (Signed)
[facility] EMERGENCY DEPT Provider Note   CSN: [id number] Arrival date & time: 09/03/22  8546     History  Chief Complaint  Patient presents with   Chest Pain   Dental Pain    Julie Coffey is a 41 y.o. female.  Pt complains of dental pain that has been ongoing since Friday.  She believes she fractured her tooth while eating a granola bar.  States pain is worsening and now radiating down to her chest.  Denies fever, chills.  States her dentist has been closed and has not been able to reach out.  She does state productive cough since yesterday.   The history is provided by the patient. No language interpreter was used.       Home Medications Prior to Admission medications   Medication Sig Start Date End Date Taking? Authorizing Provider  amoxicillin-clavulanate (AUGMENTIN) 875-125 MG tablet Take 1 tablet by mouth every 12 (twelve) hours. 09/03/22  Yes Novia Lansberry, PA-C  HYDROcodone-acetaminophen (NORCO/VICODIN) 5-325 MG tablet Take 1-2 tablets by mouth every 6 (six) hours as needed. 09/03/22  Yes Tully Mcinturff, PA-C  cephALEXin (KEFLEX) 500 MG capsule Take 500 mg by mouth 2 (two) times daily.    [provider]  chlorhexidine (HIBICLENS) 4 % external liquid Apply topically daily as needed. 06/06/19   Doristine Bosworth, MD  clindamycin (CLEOCIN-T) 1 % lotion Apply topically 2 (two) times daily. 06/06/19   Doristine Bosworth, MD  fluconazole (DIFLUCAN) 150 MG tablet Take one tablet for vaginal irritation then repeat second dose in 3 days. 06/06/19   Doristine Bosworth, MD  ibuprofen (ADVIL) 600 MG tablet Take 1 tablet (600 mg total) by mouth every 6 (six) hours as needed. 12/15/20   Linwood Dibbles, MD  metFORMIN (GLUCOPHAGE-XR) 500 MG 24 hr tablet Take 1 tablet (500 mg total) by mouth daily with breakfast. 09/19/20 10/19/20  Tanda Rockers, PA-C  nicotine polacrilex (NICORETTE) 2 MG gum Take 1 each (2 mg total) by mouth as needed for smoking cessation. Patient not  taking: Reported on 06/06/2019 08/19/18   Levie Heritage, DO  nystatin (MYCOSTATIN/NYSTOP) powder Apply topically 4 (four) times daily. Patient not taking: Reported on 06/06/2019 08/19/18   Levie Heritage, DO  predniSONE (DELTASONE) 20 MG tablet Take 2 tablets (40 mg total) by mouth daily. 06/12/21   Gwyneth Sprout, MD      Allergies    Patient has no known allergies.    Review of Systems   Review of Systems  Constitutional:  Negative for chills and fever.  HENT:  Positive for dental problem. Negative for congestion, drooling, sore throat, trouble swallowing and voice change.   Respiratory:  Negative for shortness of breath.   Cardiovascular:  Positive for chest pain.  Gastrointestinal:  Negative for abdominal pain and nausea.  All other systems reviewed and are negative.   Physical Exam Updated Vital Signs BP (!) 157/105   Pulse 87   Temp 98.3 F (36.8 C)   Resp 16   Ht 5\' 8"  (1.727 m)   Wt 113.4 kg   SpO2 100%   BMI 38.01 kg/m  Physical Exam Vitals and nursing note reviewed.  Constitutional:      General: She is not in acute distress.    Appearance: Normal appearance. She is not ill-appearing.  HENT:     Head: Normocephalic and atraumatic.     Nose: Nose normal.     Mouth/Throat:      Comments: Poor dentition noted overall.  Fractured tooth noted as noted above.  No surrounding evidence of dental abscess.  No trismus.  Without evidence of peritonsillar abscess, retropharyngeal abscess, Ludwick's angina. Eyes:     General: No scleral icterus.    Extraocular Movements: Extraocular movements intact.     Conjunctiva/sclera: Conjunctivae normal.  Cardiovascular:     Rate and Rhythm: Normal rate and regular rhythm.     Pulses: Normal pulses.  Pulmonary:     Effort: Pulmonary effort is normal. No respiratory distress.     Breath sounds: Normal breath sounds. No wheezing or rales.  Abdominal:     General: There is no distension.     Palpations: Abdomen is soft.      Tenderness: There is no abdominal tenderness. There is no guarding.  Musculoskeletal:        General: Normal range of motion.     Cervical back: Normal range of motion.  Skin:    General: Skin is warm and dry.  Neurological:     General: No focal deficit present.     Mental Status: She is alert. Mental status is at baseline.     ED Results / Procedures / Treatments   Labs (all labs ordered are listed, but only abnormal results are displayed) Labs Reviewed  CBC - Abnormal; Notable for the following components:      Result Value   Hemoglobin 15.4 (*)    Platelets 410 (*)    All other components within normal limits  BASIC METABOLIC PANEL  PREGNANCY, URINE  TROPONIN I (HIGH SENSITIVITY)  TROPONIN I (HIGH SENSITIVITY)    EKG EKG Interpretation  Date/Time:  Sunday September 03 2022 09:10:29 EST Ventricular Rate:  107 PR Interval:  154 QRS Duration: 72 QT Interval:  344 QTC Calculation: 459 R Axis:   18 Text Interpretation: Sinus tachycardia Cannot rule out Anterior infarct , age undetermined Abnormal ECG No previous ECGs available Confirmed by Alvester Chou [phone]) on 09/03/2022 11:44:07 AM  Radiology CT Maxillofacial W Contrast  Result Date: 09/03/2022 CLINICAL DATA:  Facial trauma, blunt EXAM: CT MAXILLOFACIAL WITH CONTRAST TECHNIQUE: Multidetector CT imaging of the maxillofacial structures was performed with intravenous contrast. Multiplanar CT image reconstructions were also generated. RADIATION DOSE REDUCTION: This exam was performed according to the departmental dose-optimization program which includes automated exposure control, adjustment of the mA and/or kV according to patient size and/or use of iterative reconstruction technique. CONTRAST:  28mL OMNIPAQUE IOHEXOL 300 MG/ML  SOLN COMPARISON:  None Available. FINDINGS: Osseous: No fracture or mandibular dislocation. No destructive process. Orbits: Negative. No traumatic or inflammatory finding. Sinuses: Mild paranasal  sinus mucosal thickening and small left frontal sinus retention cyst. Soft tissues: Negative. Limited intracranial: No significant or unexpected finding. IMPRESSION: No facial fracture. Electronically Signed   By: Feliberto Harts M.D.   On: 09/03/2022 14:04   DG Chest 2 View  Result Date: 09/03/2022 CLINICAL DATA:  Chest pain EXAM: CHEST - 2 VIEW COMPARISON:  Chest x-ray April 16, 2012 FINDINGS: The cardiomediastinal silhouette is unchanged in contour. Left basilar pulmonary opacity. No pleural effusion or pneumothorax. The visualized upper abdomen is unremarkable. No acute osseous abnormality. IMPRESSION: Left basilar pulmonary opacity. Differential considerations include atelectasis, aspiration, or infection. Electronically Signed   By: Jacob Moores M.D.   On: 09/03/2022 09:42    Procedures Procedures    Medications Ordered in ED Medications  ibuprofen (ADVIL) tablet 600 mg (600 mg Oral Given 09/03/22 1149)  iohexol (OMNIPAQUE) 300 MG/ML solution 100 mL (75 mLs Intravenous Contrast  Given 09/03/22 1324)  morphine (PF) 4 MG/ML injection 4 mg (4 mg Intravenous Given 09/03/22 1401)  ondansetron (ZOFRAN) injection 4 mg (4 mg Intravenous Given 09/03/22 1401)    ED Course/ Medical Decision Making/ A&P                           Medical Decision Making Amount and/or Complexity of Data Reviewed Labs: ordered. Radiology: ordered.  Risk Prescription drug management.   Medical Decision Making / ED Course   This patient presents to the ED for concern of dental pain, chest pain, this involves an extensive number of treatment options, and is a complaint that carries with it a high risk of complications and morbidity.  The differential diagnosis includes dental abscess, Ludwick's angina, ACS, PE, pneumonia  MDM: 41 year old female presents today for evaluation of above-mentioned complaints.  Overall well-appearing.  Poor dentition overall.  No evidence of dental abscess.  She states pain  is radiating down into her chest.  She did have productive cough which started yesterday.  Given her chest pain complaints she did have ACS work-up which was initiated in triage.  Chest x-ray shows evidence of potential pneumonia in the left lower lobe.  She is afebrile and without leukocytosis.  Given her productive cough and the chest x-ray changes we will start her on Augmentin to cover for pneumonia.  Will obtain CT maxillofacial with contrast to rule out surrounding abscess.  She was given pain medication in the emergency room with improvement.  CBC is without leukocytosis or anemia.  BMP is unremarkable.  Troponin of 2.  Low suspicion for ACS.  EKG without acute ischemic changes.  Will discharge with pain medication, and antibiotics.  Discussed follow-up with PCP, and dentist.  She voices understanding and is in agreement with plan.  Lab Tests: -I ordered, reviewed, and interpreted labs.   The pertinent results include:   Labs Reviewed  CBC - Abnormal; Notable for the following components:      Result Value   Hemoglobin 15.4 (*)    Platelets 410 (*)    All other components within normal limits  BASIC METABOLIC PANEL  PREGNANCY, URINE  TROPONIN I (HIGH SENSITIVITY)  TROPONIN I (HIGH SENSITIVITY)      EKG  EKG Interpretation  Date/Time:  Sunday September 03 2022 09:10:29 EST Ventricular Rate:  107 PR Interval:  154 QRS Duration: 72 QT Interval:  344 QTC Calculation: 459 R Axis:   18 Text Interpretation: Sinus tachycardia Cannot rule out Anterior infarct , age undetermined Abnormal ECG No previous ECGs available Confirmed by Alvester Chou [phone]) on 09/03/2022 11:44:07 AM         Imaging Studies ordered: I ordered imaging studies including CT maxillofacial with contrast, chest x-ray I independently visualized and interpreted imaging. I agree with the radiologist interpretation   Medicines ordered and prescription drug management: Meds ordered this encounter  Medications    ibuprofen (ADVIL) tablet 600 mg   iohexol (OMNIPAQUE) 300 MG/ML solution 100 mL   morphine (PF) 4 MG/ML injection 4 mg   ondansetron (ZOFRAN) injection 4 mg   HYDROcodone-acetaminophen (NORCO/VICODIN) 5-325 MG tablet    Sig: Take 1-2 tablets by mouth every 6 (six) hours as needed.    Dispense:  15 tablet    Refill:  0    Order Specific Question:   Supervising Provider    Answer:   Hyacinth Meeker, BRIAN [[id number]]   amoxicillin-clavulanate (AUGMENTIN) 875-125 MG tablet  Sig: Take 1 tablet by mouth every 12 (twelve) hours.    Dispense:  14 tablet    Refill:  0    Order Specific Question:   Supervising Provider    Answer:   MILLER, BRIAN [[id number]]    -I have reviewed the patients home medicines and have made adjustments as needed  Reevaluation: After the interventions noted above, I reevaluated the patient and found that they have :improved  Co morbidities that complicate the patient evaluation  Past Medical History:  Diagnosis Date   Diabetes mellitus without complication (HCC)    HSV infection       Dispostion: Patient is appropriate for discharge.  Discharged in stable condition.  Return precautions discussed.  Admission considered however patient without hypoxia, fever or other concerning symptoms or signs associated with pneumonia. she is appropriate for outpatient therapy.  Final Clinical Impression(s) / ED Diagnoses Final diagnoses:  Pain, dental  Pneumonia of left lower lobe due to infectious organism    Rx / DC Orders ED Discharge Orders          Ordered    HYDROcodone-acetaminophen (NORCO/VICODIN) 5-325 MG tablet  Every 6 hours PRN        09/03/22 1459    amoxicillin-clavulanate (AUGMENTIN) 875-125 MG tablet  Every 12 hours        09/03/22 1459              Marita Kansasli, Issak Goley, PA-C 09/03/22 1507    Terald Sleeperrifan, Matthew J, MD 09/03/22 1511

## 2022-09-03 NOTE — ED Triage Notes (Addendum)
Pt states she has had a toothache for the past few days. She states since this morning the pain has radiated down into her chest. Denies any other symptoms

## 2022-11-17 ENCOUNTER — Ambulatory Visit
Admission: EM | Admit: 2022-11-17 | Discharge: 2022-11-17 | Discharge status: Absent without leave | Payer: BC Managed Care – PPO

## 2023-02-27 ENCOUNTER — Institutional Professional Consult (permissible substitution): Payer: BC Managed Care – PPO | Admitting: Internal Medicine

## 2023-02-27 NOTE — Progress Notes (Deleted)
OV 02/27/2023  Subjective:  Patient ID: Julie Coffey, female , DOB: 1981-02-26 , age 42 y.o. , MRN: [id number] , ADDRESS: [id number] [place] [address] PCP Selinda Orion Veneta Penton, PA-C Patient Care Team: [name], PA-C as PCP - General (Internal Medicine)  This Provider for this visit: Treatment Team:  Attending Provider: Kalman Shan, MD    02/27/2023 -  No chief complaint on file.    HPI Julie Coffey 42 y.o. -    CT Chest data  No results found.  ECHO march 2024 at [facility]   - normal per my review  PFT      No data to display          Latest Reference Range & Units 06/22/18 01:07 08/19/18 15:42 06/06/19 15:26 09/19/20 09:31 09/19/20 11:26 09/03/22 11:47  Hemoglobin 12.0 - 15.0 g/dL 30.8 (H) 65.7 84.6 96.2 (H) 13.6 15.4 (H)  (H): Data is abnormally high  Latest Reference Range & Units 06/22/18 01:07 06/06/19 15:26 09/19/20 09:31 09/19/20 13:09 09/03/22 11:47  Creatinine 0.44 - 1.00 mg/dL 9.52 (H) 8.41 3.24 4.01 0.92  (H): Data is abnormally high   has a past medical history of Diabetes mellitus without complication (HCC) and HSV infection.   reports that she has quit smoking. Her smoking use included cigarettes. She smoked an average of .5 packs per day. She has never used smokeless tobacco.  Past Surgical History:  Procedure Laterality Date   ABDOMINAL HYSTERECTOMY     CESAREAN SECTION     x3   TERATOMA EXCISION      No Known Allergies   There is no immunization history on file for this patient.  No family history on file.   Current Outpatient Medications:    amoxicillin-clavulanate (AUGMENTIN) 875-125 MG tablet, Take 1 tablet by mouth every 12 (twelve) hours., Disp: 14 tablet, Rfl: 0   cephALEXin (KEFLEX) 500 MG capsule, Take 500 mg by mouth 2 (two) times daily., Disp: , Rfl:    chlorhexidine (HIBICLENS) 4 % external liquid, Apply topically daily as needed., Disp: 120 mL, Rfl: 0   clindamycin (CLEOCIN-T) 1  % lotion, Apply topically 2 (two) times daily., Disp: 60 mL, Rfl: 1   fluconazole (DIFLUCAN) 150 MG tablet, Take one tablet for vaginal irritation then repeat second dose in 3 days., Disp: 2 tablet, Rfl: 0   HYDROcodone-acetaminophen (NORCO/VICODIN) 5-325 MG tablet, Take 1-2 tablets by mouth every 6 (six) hours as needed., Disp: 15 tablet, Rfl: 0   ibuprofen (ADVIL) 600 MG tablet, Take 1 tablet (600 mg total) by mouth every 6 (six) hours as needed., Disp: 30 tablet, Rfl: 0   metFORMIN (GLUCOPHAGE-XR) 500 MG 24 hr tablet, Take 1 tablet (500 mg total) by mouth daily with breakfast., Disp: 30 tablet, Rfl: 0   nicotine polacrilex (NICORETTE) 2 MG gum, Take 1 each (2 mg total) by mouth as needed for smoking cessation. (Patient not taking: Reported on 06/06/2019), Disp: 100 tablet, Rfl: 0   nystatin (MYCOSTATIN/NYSTOP) powder, Apply topically 4 (four) times daily. (Patient not taking: Reported on 06/06/2019), Disp: 15 g, Rfl: 0   predniSONE (DELTASONE) 20 MG tablet, Take 2 tablets (40 mg total) by mouth daily., Disp: 10 tablet, Rfl: 0      Objective:   There were no vitals filed for this visit.  Estimated body mass index is 38.01 kg/m as calculated from the following:   Height as of 09/03/22: 5\' 8"  (1.727 m).   Weight as of 09/03/22:  250 lb (113.4 kg).  @WEIGHTCHANGE @  There were no vitals filed for this visit.   Physical Exam  General Appearance:    Alert, cooperative, no distress, appears stated age - *** , Deconditioned looking - *** , OBESE  - ***, Sitting on Wheelchair -  ***  Head:    Normocephalic, without obvious abnormality, atraumatic  Eyes:    PERRL, conjunctiva/corneas clear,  Ears:    Normal TM's and external ear canals, both ears  Nose:   Nares normal, septum midline, mucosa normal, no drainage    or sinus tenderness. OXYGEN ON  - *** . Patient is @ ***   Throat:   Lips, mucosa, and tongue normal; teeth and gums normal. Cyanosis on lips - ***  Neck:   Supple, symmetrical,  trachea midline, no adenopathy;    thyroid:  no enlargement/tenderness/nodules; no carotid   bruit or JVD  Back:     Symmetric, no curvature, ROM normal, no CVA tenderness  Lungs:     Distress - *** , Wheeze ***, Barrell Chest - ***, Purse lip breathing - ***, Crackles - ***   Chest Wall:    No tenderness or deformity.    Heart:    Regular rate and rhythm, S1 and S2 normal, no rub   or gallop, Murmur - ***  Breast Exam:    NOT DONE  Abdomen:     Soft, non-tender, bowel sounds active all four quadrants,    no masses, no organomegaly. Visceral obesity - ***  Genitalia:   NOT DONE  Rectal:   NOT DONE  Extremities:   Extremities - normal, Has Cane - ***, Clubbing - ***, Edema - ***  Pulses:   2+ and symmetric all extremities  Skin:   Stigmata of Connective Tissue Disease - ***  Lymph nodes:   Cervical, supraclavicular, and axillary nodes normal  Psychiatric:  Neurologic:   Pleasant - ***, Anxious - ***, Flat affect - ***  CAm-ICU - neg, Alert and Oriented x 3 - yes, Moves all 4s - yes, Speech - normal, Cognition - intact    General: No distress. *** Neuro: Alert and Oriented x 3. GCS 15. Speech normal Psych: Pleasant Resp:  Barrel Chest - ***.  Wheeze - ***, Crackles - ***, No overt respiratory distress CVS: Normal heart sounds. Murmurs - *** Ext: Stigmata of Connective Tissue Disease - *** HEENT: Normal upper airway. PEERL +. No post nasal drip        Assessment:     No diagnosis found.     Plan:     There are no Patient Instructions on file for this visit.    SIGNATURE    Dr. Kalman Shan, M.D., F.C.C.P,  Pulmonary and Critical Care Medicine Staff Physician, [facility] Center Director - Interstitial Lung Disease  Program  Pulmonary Fibrosis [facility] at [facility] [place], [place], [address]  Pager: [phone], If no answer or between  15:00h - 7:00h: call [phone] Telephone: [phone]  6:24 AM 02/27/2023

## 2023-03-15 ENCOUNTER — Encounter: Payer: Self-pay | Admitting: Internal Medicine

## 2023-03-29 ENCOUNTER — Other Ambulatory Visit: Payer: Self-pay

## 2023-03-29 ENCOUNTER — Inpatient Hospital Stay (HOSPITAL_COMMUNITY)
Admission: AD | Admit: 2023-03-29 | Discharge: 2023-04-01 | DRG: 885 | Disposition: A | Discharge status: Home / Self Care | Payer: Medicaid Other | Source: Intra-hospital | Attending: Psychiatry | Admitting: Psychiatry

## 2023-03-29 ENCOUNTER — Encounter (HOSPITAL_COMMUNITY): Payer: Self-pay | Admitting: Family Medicine

## 2023-03-29 ENCOUNTER — Emergency Department (HOSPITAL_BASED_OUTPATIENT_CLINIC_OR_DEPARTMENT_OTHER)
Admission: EM | Admit: 2023-03-29 | Discharge: 2023-03-29 | Disposition: A | Discharge status: Other Acute Inpatient Hospital | Payer: Medicaid Other | Source: Home / Self Care | Attending: Emergency Medicine | Admitting: Emergency Medicine

## 2023-03-29 ENCOUNTER — Emergency Department (HOSPITAL_BASED_OUTPATIENT_CLINIC_OR_DEPARTMENT_OTHER): Payer: Medicaid Other

## 2023-03-29 ENCOUNTER — Encounter (HOSPITAL_BASED_OUTPATIENT_CLINIC_OR_DEPARTMENT_OTHER): Payer: Self-pay

## 2023-03-29 DIAGNOSIS — F29 Unspecified psychosis not due to a substance or known physiological condition: Principal | ICD-10-CM | POA: Diagnosis present

## 2023-03-29 DIAGNOSIS — Z8541 Personal history of malignant neoplasm of cervix uteri: Secondary | ICD-10-CM | POA: Diagnosis not present

## 2023-03-29 DIAGNOSIS — Z7985 Long-term (current) use of injectable non-insulin antidiabetic drugs: Secondary | ICD-10-CM

## 2023-03-29 DIAGNOSIS — F329 Major depressive disorder, single episode, unspecified: Secondary | ICD-10-CM | POA: Insufficient documentation

## 2023-03-29 DIAGNOSIS — F22 Delusional disorders: Principal | ICD-10-CM | POA: Diagnosis present

## 2023-03-29 DIAGNOSIS — Z7984 Long term (current) use of oral hypoglycemic drugs: Secondary | ICD-10-CM

## 2023-03-29 DIAGNOSIS — R002 Palpitations: Secondary | ICD-10-CM | POA: Insufficient documentation

## 2023-03-29 DIAGNOSIS — F600 Paranoid personality disorder: Secondary | ICD-10-CM | POA: Diagnosis present

## 2023-03-29 DIAGNOSIS — F23 Brief psychotic disorder: Secondary | ICD-10-CM | POA: Diagnosis not present

## 2023-03-29 DIAGNOSIS — F419 Anxiety disorder, unspecified: Secondary | ICD-10-CM | POA: Insufficient documentation

## 2023-03-29 DIAGNOSIS — F17210 Nicotine dependence, cigarettes, uncomplicated: Secondary | ICD-10-CM | POA: Diagnosis present

## 2023-03-29 DIAGNOSIS — Z72820 Sleep deprivation: Secondary | ICD-10-CM | POA: Diagnosis not present

## 2023-03-29 DIAGNOSIS — Z79899 Other long term (current) drug therapy: Secondary | ICD-10-CM

## 2023-03-29 DIAGNOSIS — E119 Type 2 diabetes mellitus without complications: Secondary | ICD-10-CM | POA: Diagnosis present

## 2023-03-29 DIAGNOSIS — F209 Schizophrenia, unspecified: Secondary | ICD-10-CM

## 2023-03-29 LAB — ETHANOL: Alcohol, Ethyl (B): <10 mg/dL (ref <10)

## 2023-03-29 LAB — BASIC METABOLIC PANEL
Anion gap: 6 (ref 5–15)
BUN: <5 mg/dL — ABNORMAL LOW (ref 6–20)
CO2: 25 mmol/L (ref 22–32)
Calcium: 8.6 mg/dL — ABNORMAL LOW (ref 8.9–10.3)
Chloride: 105 mmol/L (ref 98–111)
Creatinine, Ser: 0.8 mg/dL (ref 0.44–1.00)
GFR, Estimated: >60 mL/min (ref >60)
Glucose, Bld: 104 mg/dL — ABNORMAL HIGH (ref 70–99)
Potassium: 3.7 mmol/L (ref 3.5–5.1)
Sodium: 136 mmol/L (ref 135–145)

## 2023-03-29 LAB — CBC
HCT: 40.1 % (ref 36.0–46.0)
Hemoglobin: 14.1 g/dL (ref 12.0–15.0)
MCH: 32 pg (ref 26.0–34.0)
MCHC: 35.2 g/dL (ref 30.0–36.0)
MCV: 90.9 fL (ref 80.0–100.0)
Platelets: 358 10*3/uL (ref 150–400)
RBC: 4.41 MIL/uL (ref 3.87–5.11)
RDW: 12.2 % (ref 11.5–15.5)
WBC: 6 10*3/uL (ref 4.0–10.5)
nRBC: 0 % (ref 0.0–0.2)

## 2023-03-29 LAB — RAPID URINE DRUG SCREEN, HOSP PERFORMED
Amphetamines: NOT DETECTED
Barbiturates: NOT DETECTED
Benzodiazepines: NOT DETECTED
Cocaine: NOT DETECTED
Opiates: NOT DETECTED
Tetrahydrocannabinol: POSITIVE — AB

## 2023-03-29 LAB — HEPATIC FUNCTION PANEL
ALT: 26 U/L (ref 0–44)
AST: 20 U/L (ref 15–41)
Albumin: 3.9 g/dL (ref 3.5–5.0)
Alkaline Phosphatase: 51 U/L (ref 38–126)
Bilirubin, Direct: <0.1 mg/dL (ref 0.0–0.2)
Total Bilirubin: 0.5 mg/dL (ref 0.3–1.2)
Total Protein: 6.8 g/dL (ref 6.5–8.1)

## 2023-03-29 LAB — TROPONIN I (HIGH SENSITIVITY): Troponin I (High Sensitivity): <2 ng/L (ref <18)

## 2023-03-29 MED ORDER — ONDANSETRON 4 MG PO TBDP: 4.0000 mg | ORAL_TABLET | Freq: Three times a day (TID) | ORAL | Status: DC | PRN

## 2023-03-29 MED ORDER — ALUM & MAG HYDROXIDE-SIMETH 200-200-20 MG/5ML PO SUSP: 30.0000 mL | Freq: Four times a day (QID) | ORAL | Status: DC | PRN

## 2023-03-29 MED ORDER — IBUPROFEN 600 MG PO TABS: 600.0000 mg | ORAL_TABLET | Freq: Three times a day (TID) | ORAL | Status: DC | PRN

## 2023-03-29 MED ORDER — OLANZAPINE 5 MG PO TBDP: 5.0000 mg | ORAL_TABLET | Freq: Three times a day (TID) | ORAL | Status: DC | PRN

## 2023-03-29 MED ORDER — LORAZEPAM 1 MG PO TABS: 1.0000 mg | ORAL_TABLET | Freq: Four times a day (QID) | ORAL | Status: DC | PRN

## 2023-03-29 MED ORDER — HYDROXYZINE HCL 25 MG PO TABS
25.0000 mg | ORAL_TABLET | Freq: Three times a day (TID) | ORAL | Status: DC | PRN
  Administered 2023-03-29: 25 mg via ORAL
  Filled 2023-03-29: qty 1

## 2023-03-29 MED ORDER — LORAZEPAM 1 MG PO TABS: 1.0000 mg | ORAL_TABLET | ORAL | Status: DC | PRN

## 2023-03-29 MED ORDER — IBUPROFEN 400 MG PO TABS: 600.0000 mg | ORAL_TABLET | Freq: Three times a day (TID) | ORAL | Status: DC | PRN

## 2023-03-29 MED ORDER — TRAZODONE HCL 50 MG PO TABS
50.0000 mg | ORAL_TABLET | Freq: Every day | ORAL | Status: DC
  Administered 2023-03-29: 50 mg via ORAL
  Filled 2023-03-29 (×4): qty 1

## 2023-03-29 MED ORDER — LORAZEPAM 1 MG PO TABS
2.0000 mg | ORAL_TABLET | Freq: Four times a day (QID) | ORAL | Status: DC | PRN
  Filled 2023-03-29: qty 2

## 2023-03-29 MED ORDER — ZIPRASIDONE MESYLATE 20 MG IM SOLR: 20.0000 mg | INTRAMUSCULAR | Status: DC | PRN
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
## 2023-03-29 NOTE — ED Notes (Signed)
Attempted to call report x 1  

## 2023-03-29 NOTE — ED Notes (Signed)
TTS in progress 

## 2023-03-29 NOTE — ED Triage Notes (Signed)
Pt complaining of a high heart rate that has been coming and going all day today. Said that he has had this before but they did not diagnose her with a-fib. She also has chest pain that is going across the left side of the chest.

## 2023-03-29 NOTE — ED Notes (Signed)
Report given to [facility]. Will call for transport.

## 2023-03-29 NOTE — Progress Notes (Signed)
   03/29/23 2048  Psych Admission Type (Psych Patients Only)  Admission Status Voluntary  Psychosocial Assessment  Patient Complaints Anxiety;Nervousness;Suspiciousness  Eye Contact Fair  Facial Expression Wide-eyed;Anxious  Affect Anxious  Speech Logical/coherent  Interaction Assertive  Motor Activity Other (Comment) (WDL)  Appearance/Hygiene Unremarkable  Behavior Characteristics Cooperative  Mood Suspicious;Preoccupied  Thought Process  Coherency WDL  Content Religiosity;Paranoia  Delusions Persecutory;Paranoid  Perception Hallucinations  Hallucination Auditory;Visual  Judgment Impaired  Confusion Mild  Danger to Self  Current suicidal ideation? Denies  Danger to Others  Danger to Others None reported or observed

## 2023-03-29 NOTE — Tx Team (Signed)
Initial Treatment Plan 03/29/2023 6:25 PM [name]    PATIENT STRESSORS: Legal issue   Occupational concerns     PATIENT STRENGTHS: Ability for insight  Average or above average intelligence  Communication skills  Supportive family/friends    PATIENT IDENTIFIED PROBLEMS: "I'm under a witchcraft spell"  "I'm seeing demons and bunny rabbit"  Paranoia  Insomnia  Hallucinations  Anxiety           DISCHARGE CRITERIA:  Ability to meet basic life and health needs Adequate post-discharge living arrangements Motivation to continue treatment in a less acute level of care  PRELIMINARY DISCHARGE PLAN: Attend aftercare/continuing care group Outpatient therapy Return to previous living arrangement  PATIENT/FAMILY INVOLVEMENT: This treatment plan has been presented to and reviewed with the patient, Julie Coffey, and/or family member.  The patient and family have been given the opportunity to ask questions and make suggestions.  Clarene Critchley, RN 03/29/2023, 6:25 PM

## 2023-03-29 NOTE — Progress Notes (Signed)
Pt was accepted to [facility] TODAY 03/29/2023. Bed assignment: 502-2  Pt meets inpatient criteria per Rockney Ghee, NP  Attending Physician will be Phineas Inches, MD  Report can be called to: - Adult unit: [phone]  Pt can arrive after 3 PM  Care Team Notified: [facility], RN, Gwyneth Sprout, MD, and [facility], Paramedic  [place], [place]  03/29/2023 2:30 PM

## 2023-03-29 NOTE — BH Assessment (Addendum)
Comprehensive Clinical Assessment (CCA) Note  03/29/2023 Julie Coffey [id number]  Disposition:  Per Lynne Leader, NP, Inpatient Treatment is recommended  The patient demonstrates the following risk factors for suicide: Chronic risk factors for suicide include: psychiatric disorder of depression and substance use disorder. Acute risk factors for suicide include: loss (financial, interpersonal, professional) and recent discharge from inpatient psychiatry. Protective factors for this patient include: responsibility to others (children, family) and hope for the future. Considering these factors, the overall suicide risk at this point appears to be low. Patient is not appropriate for outpatient follow up due to her current psychosis   GAD-7    Flowsheet Row Office Visit from 08/19/2018 in [facility] Office Visit from 07/18/2018 in [facility]  Total GAD-7 Score 12 10      PHQ2-9    Flowsheet Row ED from 03/29/2023 in [facility] Emergency Department at [facility] Office Visit from 06/06/2019 in Primary Care at [facility] Visit from 08/19/2018 in [facility] for [facility] Office Visit from 07/18/2018 in [facility]  PHQ-2 Total Score 3 0 3 6  PHQ-9 Total Score 16 -- 15 20      Flowsheet Row ED from 03/29/2023 in [facility] Emergency Department at [facility] ED from 06/12/2021 in [facility] Emergency Department at [facility] ED from 12/15/2020 in [facility] Department [facility]  C-SSRS RISK CATEGORY No Risk No Risk No Risk       Chief Complaint:  Chief Complaint  Patient presents with   Palpitations   Paranoid   Depression   Delusional   Visit Diagnosis: F29 Unspecified Psychosis    CCA Screening, Triage and Referral (STR)  Patient Reported Information How did you hear about [place]? Self  What Is the Reason for Your  Visit/Call Today? Patient presents to the [facility] ED with complaints of heart plapitations. Patient states that she has been under a lot of stress. She states that she was bullied at her job and states that it got so bad that she had to leave. She states that her neighbor has been stalking her and she had to take out charges on him and she states that paranormal things have been happening to her recently. Patient states that there has been a rabbit following her around and ends up where she is and she states that she drives for [employer] and states that she is in different cities throughout the day. She states that she feels like people from her job are working witchcraft on her and she states that she waks up with physical injuries such as scratches on her body. She feels like people are doing these things to her to torment her. Patient states that she has never attempted suicide, homicide. Patient states that she is not sleeping at night because she states that she is scared to go to sleep. She states that she has not been eating and states that she has recently lost 42 pounds. Patient states that her appetite is non-existent. Patient states that she was recently hospitalized at [facility] for three days and states that she was prescribed Prozac, but she states that she did not get the medication filled and has not followed up with an outpatient provider. Patient states that he has no history of abuse or self-mutilating behaviors.    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  She states that she has recently been driving for [employer].  Patient states that she is single, but has two daughters ages 71 and 6.  Patient denies any current legal issues.  She states that she has some college.  Patient states that she has not dated and has been celibate for the past seven years.  Patient is alert and oriented, but has some delusional thinking  and psychosis.  Her mood is depressed and she is moderately anxious.  Her insight, judgment and impulse control are impaired.  Her thoughts are mostly organized and her memory id intact.  She does not appear to be experiencing AVH.  Her speech is coherent and normal in tone and rate and her eye contact is good.  How Long Has This Been Causing You Problems? 1-6 months  What Do You Feel Would Help You the Most Today? Treatment for Depression or other mood problem   Have You Recently Had Any Thoughts About Hurting Yourself? No  Are You Planning to Commit Suicide/Harm Yourself At This time? No   Flowsheet Row ED from 03/29/2023 in [facility] Emergency Department at [facility] ED from [facility] ED from 12/15/2020 in [facility] Emergency Department at [facility]  C-SSRS RISK CATEGORY No Risk No Risk No Risk       Have you Recently Had Thoughts About Hurting Someone Karolee Ohs? No  Are You Planning to Harm Someone at This Time? No  Explanation: none reported   Have You Used Any Alcohol or Drugs in the Past 24 Hours? No  What Did You Use and How Much? hx of daily THC use, but states that she stopped using 3 weeks ago.   Do You Currently Have a Therapist/Psychiatrist? No  Name of Therapist/Psychiatrist: Name of Therapist/Psychiatrist: none reported   Have You Been Recently Discharged From Any Office Practice or Programs? Yes  Explanation of Discharge From Practice/Program: Discharged from [facility] last week     CCA Screening Triage Referral Assessment Type of Contact: Tele-Assessment  Telemedicine Service Delivery:   Is this Initial or Reassessment? Is this Initial or Reassessment?: Initial Assessment  Date Telepsych consult ordered in [facility]:  Date Telepsych consult ordered in [facility]: 03/29/23  Time Telepsych consult ordered in [facility]:  Time Telepsych consult ordered in [facility]: [id number]  Location of  Assessment: [facility]  Provider Location: [facility] Services   Collateral Involvement: none available   Does Patient Have a Automotive engineer Guardian? No  Legal Guardian Contact Information: NA  Copy of Legal Guardianship Form: -- (NA)  Legal Guardian Notified of Arrival: -- (NA)  Legal Guardian Notified of Pending Discharge: -- (NA)  If Minor and Not Living with Parent(s), Who has Custody? NA  Is CPS involved or ever been involved? Never  Is APS involved or ever been involved? Never   Patient Determined To Be At Risk for Harm To Self or Others Based on Review of Patient Reported Information or Presenting Complaint? No  Method: No Plan  Availability of Means: No access or NA  Intent: Vague intent or NA  Notification Required: No need or identified person  Additional Information for Danger to Others Potential: -- (none reported)  Additional Comments for Danger to Others Potential: none reported  Are There Guns or Other Weapons in Your Home? No  Types of Guns/Weapons: none reported  Are These Weapons Safely Secured?                            -- (  NA)  Who Could Verify You Are Able To Have These Secured: NA  Do You Have any Outstanding Charges, Pending Court Dates, Parole/Probation? none reported  Contacted To Inform of Risk of Harm To Self or Others: Other: Comment (none)    Does Patient Present under Involuntary Commitment? No    [place] of Residence: -- Ignacia Palma)   Patient Currently Receiving the Following Services: Not Receiving Services   Determination of Need: No data recorded  Options For Referral: Inpatient Hospitalization; Partial Hospitalization     CCA Biopsychosocial Patient Reported Schizophrenia/Schizoaffective Diagnosis in Past: No   Strengths: Patient states that she is intelligent   Mental Health Symptoms Depression:   Change in energy/activity; Difficulty Concentrating; Increase/decrease in  appetite; Sleep (too much or little); Weight gain/loss   Duration of Depressive symptoms:  Duration of Depressive Symptoms: Greater than two weeks   Mania:   None   Anxiety:    None   Psychosis:   Delusions   Duration of Psychotic symptoms:  Duration of Psychotic Symptoms: Less than six months   Trauma:   None   Obsessions:   None   Compulsions:   None   Inattention:   Fails to pay attention/makes careless mistakes   Hyperactivity/Impulsivity:   None   Oppositional/Defiant Behaviors:   None   Emotional Irregularity:   Transient, stress-related paranoia/disassociation   Other Mood/Personality Symptoms:   depressed mood and flat affect    Mental Status Exam Appearance and self-care  Stature:   Average   Weight:   Average weight   Clothing:   Neat/clean   Grooming:   Well-groomed   Cosmetic use:   Age appropriate   Posture/gait:   Normal   Motor activity:   Not Remarkable   Sensorium  Attention:   Normal   Concentration:   Anxiety interferes   Orientation:   X5   Recall/memory:   Normal   Affect and Mood  Affect:   Depressed   Mood:   Depressed   Relating  Eye contact:   Normal   Facial expression:   Depressed   Attitude toward examiner:   Cooperative   Thought and Language  Speech flow:  Clear and Coherent   Thought content:   Appropriate to Mood and Circumstances   Preoccupation:   None   Hallucinations:   None   Organization:   Engineer, site of Knowledge:  No data recorded  Intelligence:  No data recorded  Abstraction:  No data recorded  Judgement:  No data recorded  Reality Testing:  No data recorded  Insight:  No data recorded  Decision Making:  No data recorded  Social Functioning  Social Maturity:   Responsible   Social Judgement:   Normal   Stress  Stressors:   Work; Illness; Financial   Coping Ability:   Overwhelmed; Exhausted   Skill Deficits:    Decision making   Supports:   Church; Family     Religion: Religion/Spirituality Are You A Religious Person?: Yes What is Your Religious Affiliation?: Christian How Might This Affect Treatment?: NA  Leisure/Recreation: Leisure / Recreation Do You Have Hobbies?: No Leisure and Hobbies: none reported  Exercise/Diet: Exercise/Diet Do You Exercise?: No Have You Gained or Lost A Significant Amount of Weight in the Past Six Months?: Yes-Lost Number of Pounds Lost?: 43 Do You Follow a Special Diet?: No Do You Have Any Trouble Sleeping?: Yes Explanation of Sleeping Difficulties: not sleeping more than 4 hours per night  CCA Employment/Education Employment/Work Situation: Employment / Work Situation Employment Situation: Employed Work Stressors: thinks a Furniture conservator/restorer is following her from place to place Patient's Job has Been Impacted by Current Illness: No Has Patient ever Been in Equities trader?: No  Education: Education Is Patient Currently Attending School?: No School Currently Attending: N/A Last Grade Completed: 12 Did You Product manager?: Yes What Type of College Degree Do you Have?: no degree Did You Have An Individualized Education Program (IIEP): No Did You Have Any Difficulty At School?: No Patient's Education Has Been Impacted by Current Illness: No   CCA Family/Childhood History Family and Relationship History: Family history Marital status: Single Does patient have children?: Yes How many children?: 2 How is patient's relationship with their children?: two daughters ages 24 and 23, has a close relationship with them  Childhood History:  Childhood History By whom was/is the patient raised?: Both parents Did patient suffer any verbal/emotional/physical/sexual abuse as a child?: No Did patient suffer from severe childhood neglect?: No Has patient ever been sexually abused/assaulted/raped as an adolescent or adult?: No Was the patient ever a victim of a crime  or a disaster?: No Witnessed domestic violence?: No Has patient been affected by domestic violence as an adult?: No       CCA Substance Use Alcohol/Drug Use: Alcohol / Drug Use Pain Medications: see MAR Prescriptions: Prozac Over the Counter: see MAR History of alcohol / drug use?: Yes Longest period of sobriety (when/how long): 3 weeks clean from marijuana Negative Consequences of Use:  (none reported) Withdrawal Symptoms: None Substance #1 Name of Substance 1: marijuana 1 - Age of First Use: unknown 1 - Amount (size/oz): 1 joint 1 - Frequency: daily 1 - Duration: unknown 1 - Last Use / Amount: 3 weeks ago 1 - Method of Aquiring: unknown 1- Route of Use: smoke                       ASAM's:  Six Dimensions of Multidimensional Assessment  Dimension 1:  Acute Intoxication and/or Withdrawal Potential:   Dimension 1:  Description of individual's past and current experiences of substance use and withdrawal: patient has no current withdrawal symptoms  Dimension 2:  Biomedical Conditions and Complications:   Dimension 2:  Description of patient's biomedical conditions and  complications: Patient has no current medical conditions other than heart palpitations exacerbated by her anxiety  Dimension 3:  Emotional, Behavioral, or Cognitive Conditions and Complications:  Dimension 3:  Description of emotional, behavioral, or cognitive conditions and complications: Patient has a history of depression and anxiety  Dimension 4:  Readiness to Change:  Dimension 4:  Description of Readiness to Change criteria: Patient has discontinued her use of marijuana  Dimension 5:  Relapse, Continued use, or Continued Problem Potential:  Dimension 5:  Relapse, continued use, or continued problem potential critiera description: Patient has poor coping strategies which place her at high risk for relapse  Dimension 6:  Recovery/Living Environment:  Dimension 6:  Recovery/Iiving environment criteria  description: Patient styates that she is currenty scared to reside in her own home  ASAM Severity Score: ASAM's Severity Rating Score: 9  ASAM Recommended Level of Treatment: ASAM Recommended Level of Treatment: Level II Intensive Outpatient Treatment   Substance use Disorder (SUD) Substance Use Disorder (SUD)  Checklist Symptoms of Substance Use: Presence of craving or strong urge to use, Social, occupational, recreational activities given up or reduced due to use, Continued use despite persistent or recurrent social, interpersonal  problems, caused or exacerbated by use, Continued use despite having a persistent/recurrent physical/psychological problem caused/exacerbated by use  Recommendations for Services/Supports/Treatments: Recommendations for Services/Supports/Treatments Recommendations For Services/Supports/Treatments: CD-IOP Intensive Chemical Dependency Program  Discharge Disposition:    DSM5 Diagnoses: Patient Active Problem List   Diagnosis Date Noted   Psychosis (HCC) 03/29/2023     Referrals to Alternative Service(s): Referred to Alternative Service(s):   Place:   Date:   Time:    Referred to Alternative Service(s):   Place:   Date:   Time:    Referred to Alternative Service(s):   Place:   Date:   Time:    Referred to Alternative Service(s):   Place:   Date:   Time:     Shevelle Smither J Minda Faas, LCAS

## 2023-03-29 NOTE — Group Note (Signed)
Date:  03/29/2023 Time:  8:43 PM  Group Topic/Focus:  Wrap-Up Group:   The focus of this group is to help patients review their daily goal of treatment and discuss progress on daily workbooks.    Participation Level:  Did Not Attend   Scot Dock 03/29/2023, 8:43 PM

## 2023-03-29 NOTE — ED Notes (Signed)
Called [facility], will arrive "shortly"

## 2023-03-29 NOTE — ED Provider Notes (Signed)
Julie Coffey EMERGENCY DEPARTMENT AT [facility] Provider Note   CSN: [id number] Arrival date & time: 03/29/23  0038     History  Chief Complaint  Patient presents with   Julie Coffey   Julie Coffey   Julie Coffey   Julie Coffey    Julie Coffey is a 42 y.o. female.  The history is provided by the patient and medical records.  Julie Coffey Julie Coffey is a 42 y.o. female who presents to the Emergency Department complaining of Julie Coffey and anxiety.  She presents to the emergency department for evaluation of intermittent Julie Coffey that started several months ago.  She does report increased stress in her life that started around the initiation of her Julie Coffey.  She does have a dull ache in her chest at times.  No associated fever, shortness of breath, nausea, vomiting, abdominal pain, leg swelling or pain.  She has a history of diabetes and takes Ozempic.  She has a history of prior C-section x 3, hysterectomy.  She does not take any hormones and has no history of blood clots.  Denies any tobacco, alcohol, drug use.  She states having a significant increase in stress in her life after leaving her job several months ago that she states was due to being bullied.  She denies any SI, HI or hallucinations but does state that her neighbor has been stalking her and that there has been a bunny that has been following her from place to place.  She provides photos and video of rabbits as evidence.  She is concerned that this could be the same rabbit instead of separate rabbits in the neighborhood.  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  No reported hallucinations.  She has been seen by cardiology for her Julie Coffey and has had a stress test as well as Holter monitoring and no events were noted during her monitoring period.  She has also had her thyroid checked, which was within normal limits in the  last month. No tobacco, alcohol, drugs.      Home Medications Prior to Admission medications   Medication Sig Start Date End Date Taking? Authorizing Provider  amoxicillin-clavulanate (AUGMENTIN) 875-125 MG tablet Take 1 tablet by mouth every 12 (twelve) hours. 09/03/22   Karie Mainland, Amjad, PA-C  cephALEXin (KEFLEX) 500 MG capsule Take 500 mg by mouth 2 (two) times daily.    [provider]  chlorhexidine (HIBICLENS) 4 % external liquid Apply topically daily as needed. 06/06/19   Doristine Bosworth, MD  clindamycin (CLEOCIN-T) 1 % lotion Apply topically 2 (two) times daily. 06/06/19   Doristine Bosworth, MD  fluconazole (DIFLUCAN) 150 MG tablet Take one tablet for vaginal irritation then repeat second dose in 3 days. 06/06/19   Doristine Bosworth, MD  HYDROcodone-acetaminophen (NORCO/VICODIN) 5-325 MG tablet Take 1-2 tablets by mouth every 6 (six) hours as needed. 09/03/22   Marita Kansas, PA-C  ibuprofen (ADVIL) 600 MG tablet Take 1 tablet (600 mg total) by mouth every 6 (six) hours as needed. 12/15/20   Linwood Dibbles, MD  metFORMIN (GLUCOPHAGE-XR) 500 MG 24 hr tablet Take 1 tablet (500 mg total) by mouth daily with breakfast. 09/19/20 10/19/20  Tanda Rockers, PA-C  nicotine polacrilex (NICORETTE) 2 MG gum Take 1 each (2 mg total) by mouth as needed for smoking cessation. Patient not taking: Reported on 06/06/2019 08/19/18   Levie Heritage, DO  nystatin (MYCOSTATIN/NYSTOP) powder Apply topically 4 (four)  times daily. Patient not taking: Reported on 06/06/2019 08/19/18   Levie Heritage, DO  predniSONE (DELTASONE) 20 MG tablet Take 2 tablets (40 mg total) by mouth daily. 06/12/21   Gwyneth Sprout, MD      Allergies    Patient has no known allergies.    Review of Systems   Review of Systems  Cardiovascular:  Positive for Julie Coffey.  All other systems reviewed and are negative.   Physical Exam Updated Vital Signs BP 114/67   Pulse 73   Temp 98.1 F (36.7 C) (Oral)   Resp 18   Ht 5\' 8"   (1.727 m)   Wt 98.4 kg   SpO2 100%   BMI 32.99 kg/m  Physical Exam Vitals and nursing note reviewed.  Constitutional:      Appearance: She is well-developed.  HENT:     Head: Normocephalic and atraumatic.  Cardiovascular:     Rate and Rhythm: Normal rate and regular rhythm.     Heart sounds: No murmur heard. Pulmonary:     Effort: Pulmonary effort is normal. No respiratory distress.     Breath sounds: Normal breath sounds.  Abdominal:     Palpations: Abdomen is soft.     Tenderness: There is no abdominal tenderness. There is no guarding or rebound.  Musculoskeletal:        General: No swelling or tenderness.  Skin:    General: Skin is warm and dry.  Neurological:     Mental Status: She is alert and oriented to person, place, and time.  Psychiatric:     Comments: Anxious appearing     ED Results / Procedures / Treatments   Labs (all labs ordered are listed, but only abnormal results are displayed) Labs Reviewed  BASIC METABOLIC PANEL - Abnormal; Notable for the following components:      Result Value   Glucose, Bld 104 (*)    BUN <5 (*)    Calcium 8.6 (*)    All other components within normal limits  RAPID URINE DRUG SCREEN, HOSP PERFORMED - Abnormal; Notable for the following components:   Tetrahydrocannabinol POSITIVE (*)    All other components within normal limits  CBC  ETHANOL  HEPATIC FUNCTION PANEL  TROPONIN I (HIGH SENSITIVITY)    EKG None  Radiology DG Chest Port 1 View  Result Date: 03/29/2023 CLINICAL DATA:  Atrial fibrillation and chest pain EXAM: PORTABLE CHEST 1 VIEW COMPARISON:  12/25/2022 FINDINGS: Cardiac shadow is within normal limits. Persistent scarring is noted in the left base stable from the prior exam. No new focal infiltrate or effusion is seen. No bony abnormality is noted. IMPRESSION: Stable scarring in the left base.  No acute abnormality noted. Electronically Signed   By: Alcide Clever M.D.   On: 03/29/2023 01:00     Procedures Procedures    Medications Ordered in ED Medications - No data to display  ED Course/ Medical Decision Making/ A&P                             Medical Decision Making Amount and/or Complexity of Data Reviewed Labs: ordered. Radiology: ordered.   Patient here for evaluation of Julie Coffey and paranoia and anxiety.  She is anxious appearing on examination.  EKG is without acute arrhythmia or acute ischemic changes.  Her labs are reassuring without significant electrolyte abnormality, anemia.  Her troponin is within normal limits.  Chest x-ray is negative for acute abnormality-images personally reviewed  and interpreted, agree with radiologist interpretation.  Patient is a very Julie Coffey and perseverating on examination but she is not actively hallucinating in the emergency department.  The symptoms appear to be significantly impairing her life.  She has been medically cleared for psychiatric evaluation and treatment.  She is voluntary at this time.    Patient has been evaluated by psychiatry with recommendation for inpatient care.  Discussed with patient recommendations and she is in agreement with inpatient psychiatric care.        Final Clinical Impression(s) / ED Diagnoses Final diagnoses:  Paranoia [facility])    Rx / DC Orders ED Discharge Orders     None         Tilden Fossa, MD 03/29/23 [phone]

## 2023-03-29 NOTE — Plan of Care (Signed)
  Problem: Education: Goal: Emotional status will improve Outcome: Not Progressing Goal: Mental status will improve Outcome: Not Progressing   

## 2023-03-30 ENCOUNTER — Encounter (HOSPITAL_COMMUNITY): Payer: Self-pay

## 2023-03-30 MED ORDER — RISPERIDONE 0.5 MG PO TBDP
0.5000 mg | ORAL_TABLET | Freq: Two times a day (BID) | ORAL | Status: DC
  Administered 2023-03-30: 0.5 mg via ORAL
  Filled 2023-03-30 (×4): qty 1

## 2023-03-30 MED ORDER — TRAZODONE HCL 50 MG PO TABS
50.0000 mg | ORAL_TABLET | Freq: Every evening | ORAL | Status: DC | PRN
  Filled 2023-03-30: qty 1

## 2023-03-30 MED ORDER — VITAMIN B-1 100 MG PO TABS
100.0000 mg | ORAL_TABLET | Freq: Every day | ORAL | Status: DC
  Filled 2023-03-30 (×3): qty 1

## 2023-03-30 MED ORDER — FOLIC ACID 1 MG PO TABS
1.0000 mg | ORAL_TABLET | Freq: Every day | ORAL | Status: DC
  Filled 2023-03-30 (×3): qty 1

## 2023-03-30 MED ORDER — ADULT MULTIVITAMIN W/MINERALS CH
1.0000 | ORAL_TABLET | Freq: Every day | ORAL | Status: DC
  Filled 2023-03-30 (×3): qty 1

## 2023-03-30 MED ORDER — RISPERIDONE 1 MG PO TBDP
1.0000 mg | ORAL_TABLET | Freq: Every day | ORAL | Status: DC
  Filled 2023-03-30 (×2): qty 1

## 2023-03-30 NOTE — Group Note (Signed)
Date:  03/30/2023 Time:  8:43 PM  Group Topic/Focus:  Wrap-Up Group:   The focus of this group is to help patients review their daily goal of treatment and discuss progress on daily workbooks.    Participation Level:  Did Not Attend   Scot Dock 03/30/2023, 8:43 PM

## 2023-03-30 NOTE — H&P (Signed)
[facility] Psychiatric Admission Assessment Adult  Patient Identification: Julie Coffey MRN: [id number] DOB: 1981/10/13  Date of Evaluation: 03/30/2023, 3:19 PM Bed: 0502/0502-01  Chief Complaint: Psychosis, mania Principal Problem:   Psychosis (HCC)  HISTORY OF PRESENT ILLNESS  Julie Coffey is a 42 y.o., female with PMH of MDD, GAD, no suicide attempt, no inpt psych admission, who presented voluntary to [facility] (03/29/2023), then transferred Voluntary to [facility] (03/29/2023) for psychosis and possibly mania  PTA/Home Rx: Ozempic Psych med management: [name], MD Psychotherapy: Stopped in 2023 due to shift in insurance PCP: [name], PA-C  Patient was initially seen resting in bed awake during lunch break, no acute distress, declined any meals. During evaluation, patient was pleasant, engaged, hyperverbal, mildly tangential. Patient reported that she was admitted for "because I quit my job and the other bullying me for it"  06/2022, patient stated that she started receiving dreams from God as warnings, about her past environment job.  Stated that because she did not take heat from the dream, that things started happening to her.  Most notably she believes that her manager and other coworkers are witches and have been using incantation against her.  Stated that she does not know their intention, stated because she was looking for a new job, they are punishing her because she is quitting because of the revenue she has bring in for them.  Stated that she notices various things that she knows to be curses from the manager, noted beeping on computer, monitors turning off, or not turning on, intonation of people's voices when they are talking to her.   01/23/2023, patient officially left her current place of employment.  Stated that the bullying was worsening, making her feel much more anxious. 01/24/2023, patient stated she woke up confused and  receiving "negative answers".  Her anxiety was exacerbated when she noted a brown bunny, that keeps appearing, despite her perception that this is not the season for bunnies.  Patient came to the conclusion that the round body is of the same brown body, and that is actually the witch/managers familiar following her.  02/2023, patient stated that she had difficulties with daytime somnolence, despite drinking a large cup of coffee a day and taking caffeine pill x1 daily.  She was admitted to [facility] hospital for workup, which resulted negative.  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 03/29/2023, she had severe anxiety with heart palpitations, she was concerned that it could possibly be due to her heart, she presented to med Center in [facility].   During current evaluation:  She continues to perseverate on the which, and how she is not "crazy".  Also that she has been on a strict vegan diet.  Reported that sometime last year she was on an antidepressant, although she does not remember which one.  Does remember in January her psychiatrist tried to start her on Lamictal, which she did not take.  The only medication she consistently take currently is Ozempic, which she has not taken for the past few weeks.  Stated that her diabetes is now diet controlled, as she started fasting routinely, at least once a week, sometimes for about 72 hours.  Stated that otherwise there has been no other changes last year that could have started this.  She endorses smoking 1 joint nighly since she was ~42yo, with the same supplier.    Mood:Anxious -still worried about getting cursed, stated she wants to go  home. Sleep:Poor-reported hypersomnolence, per daughter she sleeps about 3-4 hours a night Appetite: Poor - "fasting and vegan" SI:No (denied active and passive SI) HI:No (denied active and passive HI) GEX:BMWU, Visual (VH vs thought insertion about dreams from god warning her about the people who are  bullying) Ideas of Reference:Percusatory, Paranoia, Delusions  Review of Systems  Constitutional:  Positive for malaise/fatigue and weight loss.  Respiratory:  Negative for shortness of breath.   Cardiovascular:  Positive for palpitations. Negative for chest pain.  Gastrointestinal:  Negative for abdominal pain, constipation, diarrhea, nausea and vomiting.  Neurological:  Positive for dizziness and headaches.   PSYCH ROS  Depression Symptoms:  Denied depressed mood and pervasive sadness, anhedonia, and suicidal ideation or intentions Duration of Depression Symptoms: Greater than two weeks  (Hypo) Manic Symptoms:  Exhibited excessive energy despite decreased need for sleep or persistent irritability (~3-4hrs/night since March/May 2024), mild grandiosity/inflated self-esteem, pressured speech, distractibility/inattention, goal directed activity (buying crystals and picking up multiple shifts with [employer] and [employer] all day and night), per daughter patient has become significantly more tearful, well about her baseline. Anxiety Symptoms:   Reported having difficulty controlling/managing anxiety/worry/stress and that it is out of proportion with stressors. With associated sxs of  easily fatigued, concentration difficulty, muscle tension.  Psychotic Symptoms:  Expressed VH vs thought insertion - dreams from God recurrently.  Delusions: paranoia, persecutory,  Trauma Symptoms:  Trauma: Denied life-threatening, physical, sexual, witnessed.  Denied flashbacks, nightmares, hypervigilance/hyperarousal, avoidance. Nov 2022 - day of third daughter's death - died in her arm   COLLATERAL  [name] (daughter) @ [phone] Recent quit her past job after working there for years.   2 year ago dx with diabetes. Last 6 months patient stopped eating meat, mainly eating fruit, vegetable and water.  Stated that mom would be come focused on things - picking up more shifts all day and night, crystals No  suicidal or homicidal concern from daughter. Is worried about mom's fasting and decreased sleep.  Nov 2022 - day of third daughter's death - died in her arm  May 26, 2023- DOB of deceased daughter   2024/04/04daughter notice behavioral changes  - quit job  - fixated on and getting crystals - for paranoia  - patient is not sleeping since 01/25/23, getting sleep 3-4hr/night  - picking up more work  - more tearful  - major behavioral changes over <1 week  Minor changes started Nov 2023  HISTORY  Past Psychiatric History:  Diagnoses: MDD, GAD Inpatient treatment: NA Suicide: NA Homicide: NA Medication history: ?prozac, paxil, zoloft, xanax.  Dispensed lamictal but never took Psychotherapy: Last time 2023 due to change in insurance  Past Medical/Surgical History:  Medical Diagnoses: DM2 - ozempic, cervical cancer s/p hysterectomy, HLD Prior Hosp: yes Prior Surgeries/Trauma: c-section x3, hysterectomy - cervical cancer (2005) Concussions/Head Trauma/LOC: NA Seizures: NA PCP: [name], PA-C  Allergies: Patient has no known allergies.   Family Psychiatric History:  BiPD: Denied SCzA/SCZ: ? Cousin, multiple family members on maternal and paternal side Psych Rx: Unsure, likely Suicide: N/A Homicide: N/A Inpatient psych: multiple family members on maternal and paternal side Substance use: mom - crack  Social History:  Housing: Living in home, daughter residing with her Finances: employed - Social research officer, government and [employer] Support: family Children: daughters x3 - 1 deceased, 25yo, 17yo  Legal: NA Developmental: denied substance exposure in utero  Substance Use History: Alcohol:  reports current alcohol use. - rarely, ~3x/year Nicotine:  reports that she has  quit smoking. Her smoking use included cigarettes. She smoked an average of .5 packs per day. She has never used smokeless tobacco.-Sustained remission since 2021 Marijuana: A joint daily since 41 years old IV drug use:  N/A Stimulants: N/A Opiates: N/A Sedative/hypnotics: N/A Hallucinogens: N/A H/O withdrawals, blackouts: Denied H/O DT: Denied H/O Detox / Rehab: Denied DUI/DWI: Denied  Is the patient at risk to self? Yes  Has the patient been a risk to self in the past 6 months? No.  Has the patient been a risk to self within the distant past? No.  Is the patient a risk to others? No.  Has the patient been a risk to others in the past 6 months? No.  Has the patient been a risk to others within the distant past? No.   Substance Abuse History in the last 12 months:  Yes.   Alcohol Screening: Patient refused Alcohol Screening Tool: Yes 1. How often do you have a drink containing alcohol?: Never Alcohol Brief Interventions/Follow-up: Patient Refused Tobacco Screening:    Current Medications: Current Facility-Administered Medications  Medication Dose Route Frequency Provider Last Rate Last Admin   alum & mag hydroxide-simeth (MAALOX/MYLANTA) 200-200-20 MG/5ML suspension 30 mL  30 mL Oral Q6H PRN Bing Neighbors, NP       hydrOXYzine (ATARAX) tablet 25 mg  25 mg Oral TID PRN Bing Neighbors, NP   25 mg at 03/29/23 2048   ibuprofen (ADVIL) tablet 600 mg  600 mg Oral Q8H PRN Bing Neighbors, NP       LORazepam (ATIVAN) tablet 2 mg  2 mg Oral Q6H PRN Bing Neighbors, NP       ondansetron (ZOFRAN-ODT) disintegrating tablet 4 mg  4 mg Oral Q8H PRN Bing Neighbors, NP       risperiDONE (RISPERDAL M-TABS) disintegrating tablet 0.5 mg  0.5 mg Oral BID Princess Bruins, DO       traZODone (DESYREL) tablet 50 mg  50 mg Oral QHS Bing Neighbors, NP   50 mg at 03/29/23 2048   PTA Medications: Medications Prior to Admission  Medication Sig Dispense Refill Last Dose   Semaglutide,0.25 or 0.5MG /DOS, (OZEMPIC, 0.25 OR 0.5 MG/DOSE,) 2 MG/3ML SOPN Inject 0.5 mg into the skin once a week. Sunday   03/17/2023   valACYclovir (VALTREX) 500 MG tablet Take 1,000 mg by mouth daily as needed. For flare-up only        OBJECTIVE  BP 130/79 (BP Location: Right Arm)   Pulse (!) 119   Temp 98.8 F (37.1 C) (Oral)   Resp 18   Ht 5\' 8"  (1.727 m)   Wt 98.4 kg   SpO2 100%   BMI 32.99 kg/m  Physical Findings: Physical Exam Vitals and nursing note reviewed.  Constitutional:      General: She is awake. She is not in acute distress.    Appearance: She is not ill-appearing or diaphoretic.  HENT:     Head: Normocephalic.  Pulmonary:     Effort: Pulmonary effort is normal. No respiratory distress.  Neurological:     Mental Status: She is alert.    Musculoskeletal: Strength & Muscle Tone: within normal limits  Gait & Station: normal  Patient leans: N/A   Presentation  General Appearance:Appropriate for Environment, Casual, Fairly Groomed Eye Contact:Good Speech:Clear and Coherent, Normal Rate Volume:Normal Handedness:Right  Mood and Affect  Mood:Anxious Affect:Congruent, Appropriate, Full Range  Thought Process  Thought Process:Coherent, Goal Directed Descriptions of Associations:Circumstantial  Thought Content Suicidal Thoughts:Suicidal  Thoughts: No (denied active and passive SI) Homicidal Thoughts:Homicidal Thoughts: No (denied active and passive HI) Hallucinations:Hallucinations: None, Visual (VH vs thought insertion about dreams from god warning her about the people who are bullying) Ideas of Reference:Percusatory, Paranoia, Delusions Thought Content:Rumination, Perseveration, Delusions, Obsessions, Paranoid Ideation, Illogical  Sensorium  Memory:Immediate Fair, Recent Fair Judgment:Impaired Insight:None  Art therapist  Orientation:Full (Time, Place and Person) Language:Good Concentration:Fair Attention:Fair Recall:Good Fund of Knowledge:Good  Psychomotor Activity  Psychomotor Activity:Psychomotor Activity: Normal  Sleep  Quality:Poor  Assets  Assets:Communication Skills, Desire for Improvement, Health and safety inspector, Housing, Physical Health,  Resilience, Social Support, Talents/Skills, Transportation, Vocational/Educational  AIMS: Facial and Oral Movements Muscles of Facial Expression: None, normal Lips and Perioral Area: None, normal Jaw: None, normal Tongue: None, normal,Extremity Movements Upper (arms, wrists, hands, fingers): None, normal Lower (legs, knees, ankles, toes): None, normal, Trunk Movements Neck, shoulders, hips: None, normal, Overall Severity Severity of abnormal movements (highest score from questions above): None, normal Incapacitation due to abnormal movements: None, normal Patient's awareness of abnormal movements (rate only patient's report): No Awareness, Dental Status Current problems with teeth and/or dentures?: No Does patient usually wear dentures?: No  Lab Results:  Results for orders placed or performed during the hospital encounter of 03/29/23 (from the past 48 hour(s))  Basic metabolic panel     Status: Abnormal   Collection Time: 03/29/23  1:14 AM  Result Value Ref Range   Sodium 136 135 - 145 mmol/L   Potassium 3.7 3.5 - 5.1 mmol/L   Chloride 105 98 - 111 mmol/L   CO2 25 22 - 32 mmol/L   Glucose, Bld 104 (H) 70 - 99 mg/dL    Comment: Glucose reference range applies only to samples taken after fasting for at least 8 hours.   BUN <5 (L) 6 - 20 mg/dL   Creatinine, Ser 1.61 0.44 - 1.00 mg/dL   Calcium 8.6 (L) 8.9 - 10.3 mg/dL   GFR, Estimated >09 >60 mL/min    Comment: (NOTE) Calculated using the CKD-EPI Creatinine Equation (2021)    Anion gap 6 5 - 15    Comment: Performed at [facility], [address] [place], [place] [address]  CBC     Status: None   Collection Time: 03/29/23  1:14 AM  Result Value Ref Range   WBC 6.0 4.0 - 10.5 K/uL   RBC 4.41 3.87 - 5.11 MIL/uL   Hemoglobin 14.1 12.0 - 15.0 g/dL   HCT 81.1 91.4 - 78.2 %   MCV 90.9 80.0 - 100.0 fL   MCH 32.0 26.0 - 34.0 pg   MCHC 35.2 30.0 - 36.0 g/dL   RDW 95.6 21.3 - 08.6 %   Platelets 358 150 - 400 K/uL    nRBC 0.0 0.0 - 0.2 %    Comment: Performed at [facility], [address] [place], [place] [address]  Troponin I (High Sensitivity)     Status: None   Collection Time: 03/29/23  1:14 AM  Result Value Ref Range   Troponin I (High Sensitivity) <2 <18 ng/L    Comment: (NOTE) Elevated high sensitivity troponin I (hsTnI) values and significant  changes across serial measurements may suggest ACS but many other  chronic and acute conditions are known to elevate hsTnI results.  Refer to the "Links" section for chest pain algorithms and additional  guidance. Performed at [facility], [address] [place], [place] [address]   Hepatic function panel  Status: None   Collection Time: 03/29/23  1:14 AM  Result Value Ref Range   Total Protein 6.8 6.5 - 8.1 g/dL   Albumin 3.9 3.5 - 5.0 g/dL   AST 20 15 - 41 U/L   ALT 26 0 - 44 U/L   Alkaline Phosphatase 51 38 - 126 U/L   Total Bilirubin 0.5 0.3 - 1.2 mg/dL   Bilirubin, Direct <1.6 0.0 - 0.2 mg/dL   Indirect Bilirubin NOT CALCULATED 0.3 - 0.9 mg/dL    Comment: Performed at [facility], [address] [place], [place] [address]  Ethanol     Status: None   Collection Time: 03/29/23  3:34 AM  Result Value Ref Range   Alcohol, Ethyl (B) <10 <10 mg/dL    Comment: (NOTE) Lowest detectable limit for serum alcohol is 10 mg/dL.  For medical purposes only. Performed at [facility], [address] [place], [place] [address]   Urine rapid drug screen (hosp performed)     Status: Abnormal   Collection Time: 03/29/23  3:38 AM  Result Value Ref Range   Opiates NONE DETECTED NONE DETECTED   Cocaine NONE DETECTED NONE DETECTED   Benzodiazepines NONE DETECTED NONE DETECTED   Amphetamines NONE DETECTED NONE DETECTED   Tetrahydrocannabinol POSITIVE (A) NONE DETECTED   Barbiturates NONE DETECTED NONE DETECTED    Comment: (NOTE) DRUG SCREEN FOR MEDICAL PURPOSES ONLY.  IF CONFIRMATION IS NEEDED FOR  ANY PURPOSE, NOTIFY LAB WITHIN 5 DAYS.  LOWEST DETECTABLE LIMITS FOR URINE DRUG SCREEN Drug Class                     Cutoff (ng/mL) Amphetamine and metabolites    1000 Barbiturate and metabolites    200 Benzodiazepine                 200 Opiates and metabolites        300 Cocaine and metabolites        300 THC                            50 Performed at [facility], [address] [place], [place] [address]    Blood Alcohol level:  Lab Results  Component Value Date   [facility] <10 03/29/2023   Metabolic Disorder Labs:  Lab Results  Component Value Date   HGBA1C 5.6 06/06/2019   No results found for: "PROLACTIN" Lab Results  Component Value Date   CHOL 189 08/19/2018   TRIG 69 08/19/2018   HDL 49 08/19/2018   CHOLHDL 3.9 08/19/2018   LDLCALC 126 (H) 08/19/2018   Below from [place] ED/Hospital Procedure : This is a routine 21 channel adult electroencephalogram with  one channel dedicated to limited EKG recording. Various montages were used  in the acquisition of the EEG. Activating procedures included photic  stimulation, but hyperventilation was not performed as the patient would  not cooperate.  State : This study was obtained with the patient in the awake and asleep  state.  EEG Description : The posterior dominant rhythm consisted of a frequency  of 10-11 Hz with an amplitude ranging between 15-25 V. It was found to  be symmetric across the bilateral posterior derivations and attenuated  with eye opening. The remaining background consisted of alpha and beta  that was fairly well organized, continuous, symmetric, and reactive.  Photic stimulation did not elicit driving response.  Hyperventilation was  not performed as the patient would not cooperate.  Drowsiness was evidenced by alpha dropout, decreased muscle artifact, slow  roving eye movements and vertex waves. Stage N2 sleep was evidenced by  sleep spindles and K-complexes.  There were no focal  abnormalities, epileptiform abnormalities, or  electrographic seizures.  EEG Interpretation : This is normal awake and asleep EEG.  Clinical Correlation : While a normal EEG does not rule out epilepsy,  there was no evidence of an underlying seizure disorder on this study.  Trinda Pascal, MD  03/08/2023, 12:03 PM   INDICATION:Pt coming in endorsing difficulty staying awake x1 month. Pt states despite taking in substantial amounts of caffeine, she can't stay awake. Her head is now hurting. TECHNIQUE: CT HEAD WO CONTRAST FINDINGS: CT HEAD No acute intracranial hemorrhage identified. No acute midline shift or acute mass effect. No abnormal extra-axial fluid collections are seen. Ventricles, cisterns, and sulci are unremarkable. Visualized orbits are unremarkable. Visualized paranasal sinuses are clear. Mastoid air cells are clear. No displaced or depressed calvarial fracture identified. IMPRESSION: CT HEAD No acute intracranial abnormality identified. Electronically Signed by: Lambert Keto, MD on 03/27/2023 5:46 AM   MRI Head WO Contrast  Final Result  IMPRESSION:  No intracranial hemorrhage.  No acute infarction.  Small nonspecific white matter T2 and FLAIR hyperintensities which could represent early small vessel ischemic change.  Electronically Signed by: Azzie Almas, MD on 03/08/2023 7:21 AM   Apnea link negative with index score 1.7   Units 03/06/23 CK ng/mL 361*  2.15     ASSESSMENT / PLAN   Principal Problem:   Psychosis (HCC)   Safety and Monitoring: Voluntary admission to inpatient psychiatric unit for safety, stabilization and treatment   2. Psychiatric Diagnoses and Treatment:   Acute v subacute psychosis, R/O medical causes per below Unclear etiology. Sxs of paranoid, persecutory, hyper-religious delusions, possibly VH v thought insertion. Also sxs of poor sleep (intentional v unintentional?) - 3-4hr/night for ~1.5 months. Decreased appetite (intentional  v unintentional v ozempic) with ~45lb weight loss over 2 months.  Symptoms of mild paranoia started November 2023, however daughter did not note significant behavioral changes up until March 2024, acutely.  Stated that majority of major behavior changes occurred in less than 1 week time (paranoid, decreased need for sleep, eating less, increased tearfulness, preoccupation with getting crystals for protection, increased shifts in job all day and night). Given acute v subacute onset of her symptoms, along with physical symptoms of ~10x syncope without prodrome especially during driving (see Tressia Danas, DO - 03/08/2023 3:13 AM EDT notes at [facility]), associated headaches.  Structural brain, seizure, cardiac etiologies r/o - during that hospital admission, EEG negative, head CT & MRI unremarkable, partial sleep test negative for apnea (outpatient f/u for full sleep test), multiple nutritional deficiencies, stress test/troponin negative for heart etiology thus far.  Psychiatry was consulted during that time and started her on Prozac, unclear if she took the medication or not.  Wondering about anti-NMDA encephalopathy - still has ovaries. Wondering about lupus. Will continue FEP workup per below to r/o metabolic, infection, nutritional, autoimmune etiologies. In the mean time, starting high potency antipsychotic per below. Also workup for nutritional deficit. Also considering primary psych disease if medical workup is negative, possible paranoid schizophrenia. Considered MDD+psychosis and bipolar d/o with psychosis, however currently minimal evidence of mood changes. Consolidated meds/vitamins to night time since patient doesn't taking pills. Obtaining antipsychotic monitoring labs, lipid panel already collected. STARTED  risperdal 1 mg qHS FEP/AMS w/u: TSH+FT4, B12, thiamine, folate, ANA+reflex, ESR, heavy metals, lead, RPR, HIV, ceruloplasmin, urine porphobilinogen, autoimmune  encephalopathy panel Nutritional deficit w/u:  vitamin B12, thiamine, folate, Fe+TIBC+Ferritin, Zinc, Mg, Phos Antipsychotic monitoring: A1c  Cannabis use d/o UDS positive. Smokes 1 joint daily since 42yo, stopped last month for concern of worsening anxiety Encouraged cessation   3. Medical Issues Being Addressed:  DM2 - on home ozempic which is held  4. Routine and other pertinent labs:  BMI:33  QTc: 399 CMP showed mildly elevated gluc, otherwise WNL CBC WNL UDS + THC BAL <10  5. Disposition Planning:  Tentative Date: TBA  Barrier: psychosis Location: Home with family  Treatment Plan Summary: I certify that inpatient services furnished can reasonably be expected to improve the patient's condition.   Daily contact with patient to assess and evaluate symptoms and progress in treatment and Medication management Daily contact with patient to assess and evaluate symptoms and progress in treatment Patient's case to be discussed in multi-disciplinary team meeting Observation Level: q15 minute checks  Vital signs: q12 hours Precautions: suicide, elopement, and assault The risks/benefits/side-effects/alternatives to this medication were discussed in detail with the patient and time was given for questions. The patient consents to medication trial. The patient consents to medication trial. FDA black box warnings, if present, were discussed. Metabolic profile and EKG monitoring obtained while on an atypical antipsychotic  Encouraged patient to participate in unit milieu and in scheduled group therapies  Short Term Goals: Ability to identify changes in lifestyle to reduce recurrence of condition will improve, Ability to verbalize feelings will improve, Ability to disclose and discuss suicidal ideas, Ability to demonstrate self-control will improve, Ability to identify and develop effective coping behaviors will improve, Ability to maintain clinical measurements within normal limits will  improve, Compliance with prescribed medications will improve, and Ability to identify triggers associated with substance abuse/mental health issues will improve Long Term Goals: Improvement in symptoms so as ready for discharge Social work and case management to assist with discharge planning and identification of hospital follow-up needs prior to discharge Estimated LOS: 5-7 days Discharge Concerns: Need to establish a safety plan; Medication compliance and effectiveness Discharge Goals: Return home with outpatient referrals for mental health follow-up including medication management/psychotherapy  Total Time Spent in Direct Patient Care:  Patient's case was discussed with Attending, see attestation for more information  Signed: Princess Bruins, DO Psychiatry Resident, PGY-2 [facility] - [facility]  [address] [place], [place] [address] Ph: [phone] Fax: [phone]

## 2023-03-30 NOTE — BH IP Treatment Plan (Signed)
Interdisciplinary Treatment and Diagnostic Plan Update  03/30/2023 Time of Session: 0830 Julie Coffey MRN: [id number]  Principal Diagnosis: Psychosis [facility])  Secondary Diagnoses: Principal Problem:   Psychosis (HCC)   Current Medications:  Current Facility-Administered Medications  Medication Dose Route Frequency Provider Last Rate Last Admin   alum & mag hydroxide-simeth (MAALOX/MYLANTA) 200-200-20 MG/5ML suspension 30 mL  30 mL Oral Q6H PRN Bing Neighbors, NP       hydrOXYzine (ATARAX) tablet 25 mg  25 mg Oral TID PRN Bing Neighbors, NP   25 mg at 03/29/23 2048   ibuprofen (ADVIL) tablet 600 mg  600 mg Oral Q8H PRN Bing Neighbors, NP       LORazepam (ATIVAN) tablet 2 mg  2 mg Oral Q6H PRN Bing Neighbors, NP       ondansetron (ZOFRAN-ODT) disintegrating tablet 4 mg  4 mg Oral Q8H PRN Bing Neighbors, NP       traZODone (DESYREL) tablet 50 mg  50 mg Oral QHS Bing Neighbors, NP   50 mg at 03/29/23 2048   PTA Medications: Medications Prior to Admission  Medication Sig Dispense Refill Last Dose   Semaglutide,0.25 or 0.5MG /DOS, (OZEMPIC, 0.25 OR 0.5 MG/DOSE,) 2 MG/3ML SOPN Inject 0.5 mg into the skin once a week. Sunday   03/17/2023   valACYclovir (VALTREX) 500 MG tablet Take 1,000 mg by mouth daily as needed. For flare-up only       Patient Stressors: Legal issue   Occupational concerns    Patient Strengths: Ability for insight  Average or above average intelligence  Communication skills  Supportive family/friends   Treatment Modalities: Medication Management, Group therapy, Case management,  1 to 1 session with clinician, Psychoeducation, Recreational therapy.   Physician Treatment Plan for Primary Diagnosis: Psychosis (HCC) Long Term Goal(s):     Short Term Goals:    Medication Management: Evaluate patient's response, side effects, and tolerance of medication regimen.  Therapeutic Interventions: 1 to 1 sessions, Unit Group sessions and Medication  administration.  Evaluation of Outcomes: Progressing  Physician Treatment Plan for Secondary Diagnosis: Principal Problem:   Psychosis (HCC)  Long Term Goal(s):     Short Term Goals:       Medication Management: Evaluate patient's response, side effects, and tolerance of medication regimen.  Therapeutic Interventions: 1 to 1 sessions, Unit Group sessions and Medication administration.  Evaluation of Outcomes: Progressing   RN Treatment Plan for Primary Diagnosis: Psychosis (HCC) Long Term Goal(s): Knowledge of disease and therapeutic regimen to maintain health will improve  Short Term Goals: Ability to remain free from injury will improve, Ability to verbalize frustration and anger appropriately will improve, Ability to demonstrate self-control, Ability to participate in decision making will improve, Ability to verbalize feelings will improve, Ability to disclose and discuss suicidal ideas, Ability to identify and develop effective coping behaviors will improve, and Compliance with prescribed medications will improve  Medication Management: RN will administer medications as ordered by provider, will assess and evaluate patient's response and provide education to patient for prescribed medication. RN will report any adverse and/or side effects to prescribing provider.  Therapeutic Interventions: 1 on 1 counseling sessions, Psychoeducation, Medication administration, Evaluate responses to treatment, Monitor vital signs and CBGs as ordered, Perform/monitor CIWA, COWS, AIMS and Fall Risk screenings as ordered, Perform wound care treatments as ordered.  Evaluation of Outcomes: Progressing   LCSW Treatment Plan for Primary Diagnosis: Psychosis (HCC) Long Term Goal(s): Safe transition to appropriate next level of care at  discharge, Engage patient in therapeutic group addressing interpersonal concerns.  Short Term Goals: Engage patient in aftercare planning with referrals and resources,  Increase social support, Increase ability to appropriately verbalize feelings, Increase emotional regulation, Facilitate acceptance of mental health diagnosis and concerns, Facilitate patient progression through stages of change regarding substance use diagnoses and concerns, Identify triggers associated with mental health/substance abuse issues, and Increase skills for wellness and recovery  Therapeutic Interventions: Assess for all discharge needs, 1 to 1 time with Social worker, Explore available resources and support systems, Assess for adequacy in community support network, Educate family and significant other(s) on suicide prevention, Complete Psychosocial Assessment, Interpersonal group therapy.  Evaluation of Outcomes: Progressing   Progress in Treatment: Attending groups: Yes. Participating in groups: Yes. Taking medication as prescribed: Yes. Toleration medication: Yes. Family/Significant other contact made: No, will contact:  CSW to obtain consent  Patient understands diagnosis: No. Discussing patient identified problems/goals with staff: Yes. Medical problems stabilized or resolved: Yes. Denies suicidal/homicidal ideation: Yes. Issues/concerns per patient self-inventory: Yes. Other: none  New problem(s) identified: No, Describe:  none  New Short Term/Long Term Goal(s): Patient to work towards detox, elimination of symptoms of psychosis, medication management for mood stabilization; development of comprehensive mental wellness/sobriety plan.   Patient Goals: Patient states their goal for treatment is to "get discharged."  Discharge Plan or Barriers: No psychosocial barriers identified at this time, patient to return to place of residence when appropriate for discharge.   Reason for Continuation of Hospitalization: Medication stabilization Other; describe psychotic features   Estimated Length of Stay: 1-7 days   Last 3 [place] Suicide Severity Risk Score: Flowsheet Row  [facility] 06/12/2021 in [facility] Department at [facility] ED from 12/15/2020 in [facility] [place] Emergency Department at [facility]  C-SSRS RISK CATEGORY No Risk No Risk No Risk       Last PHQ 2/9 Scores:    03/29/2023    5:30 AM 06/06/2019    2:08 PM 08/19/2018    2:42 PM  Depression screen PHQ 2/9  Decreased Interest  0 2  Down, Depressed, Hopeless  0 1  PHQ - 2 Score  0 3  Altered sleeping   3  Tired, decreased energy   2  Change in appetite   3  Feeling bad or failure about yourself    2  Trouble concentrating   2  Moving slowly or fidgety/restless   0  Suicidal thoughts   0  PHQ-9 Score   15  Difficult doing work/chores        Information is confidential and restricted. Go to Review Flowsheets to unlock data.    Scribe for Treatment Team: Almedia Balls 03/30/2023 11:30 AM

## 2023-03-30 NOTE — Group Note (Signed)
Recreation Therapy Group Note   Group Topic:Goal Setting  Group Date: 03/30/2023 Start Time: 0955 End Time: 1020 Facilitators: Keelan Tripodi-McCall, LRT,CTRS Location: 500 Hall Dayroom   Goal Area(s) Addresses:  Patient will participate in discussion of what a goal is. Patient will successfully identify goals and obstacles.  Group Description: DREAMS.  LRT and patients discussed what goals are and why they are important.  Patients were then given a worksheet with the word dream in large block letters.  Patients were to write their goals inside the letters such what they want to be or what they want out of life.  Patients would then write the obstacles on the outside of the letters and at the bottom of the paper, identify ways to overcome those challenges.    Affect/Mood: N/A   Participation Level: Did not attend    Clinical Observations/Individualized Feedback:     Plan: Continue to engage patient in RT group sessions 2-3x/week.   Harlan Ervine-McCall, LRT,CTRS 03/30/2023 10:50 AM

## 2023-03-30 NOTE — BHH Group Notes (Signed)
Spirituality group facilitated by Chaplain Katy Aashi Derrington, BCC.   Group Description: Group focused on topic of hope. Patients participated in facilitated discussion around topic, connecting with one another around experiences and definitions for hope. Group members engaged with visual explorer photos, reflecting on what hope looks like for them today. Group engaged in discussion around how their definitions of hope are present today in hospital.   Modalities: Psycho-social ed, Adlerian, Narrative, MI   Patient Progress: Did not attend.  

## 2023-03-30 NOTE — Group Note (Signed)
Date:  03/30/2023 Time:  1:42 PM  Group Topic/Focus:  Self Care:   The focus of this group is to help patients understand the importance of self-care in order to improve or restore emotional, physical, spiritual, interpersonal, and financial health.    Participation Level:  Did Not Attend   Sonny Dandy Jaree Trinka 03/30/2023, 1:42 PM

## 2023-03-30 NOTE — BHH Suicide Risk Assessment (Signed)
[facility] Admission Suicide Risk Assessment  Nursing information obtained from: Patient Demographic factors: Unemployed, Low socioeconomic status Current Mental Status: NA Loss Factors: Financial problems / change in socioeconomic status, Decrease in vocational status Historical Factors: NA Risk Reduction Factors: Positive social support  Principal Problem: Psychosis (HCC) Diagnosis: Principal Problem:   Psychosis (HCC)   Subjective Data:  Julie Coffey is a 42 y.o. female with PMH of MDD, GAD, no suicide attempt, no inpt psych admission, who presented voluntary to [facility] (03/29/2023), then transferred Voluntary to [facility] (03/29/2023) for psychosis and possibly mania   Home Rx: ozempic weekly  Continued Clinical Symptoms:    The "Alcohol Use Disorders Identification Test", Guidelines for Use in Primary Care, Second Edition.  World Science writer [facility]). Score between 0-7:  no or low risk or alcohol related problems. Score between 8-15:  moderate risk of alcohol related problems. Score between 16-19:  high risk of alcohol related problems. Score 20 or above:  warrants further diagnostic evaluation for alcohol dependence and treatment.  CLINICAL FACTORS:  More than one psychiatric diagnosis Currently Psychotic Unstable or Poor Therapeutic Relationship Previous Psychiatric Diagnoses and Treatments Medical Diagnoses and Treatments/Surgeries  Musculoskeletal: Strength & Muscle Tone: within normal limits Gait & Station: normal Patient leans: N/A   Presentation  General Appearance:Appropriate for Environment, Casual, Fairly Groomed Eye Contact:Good Speech:Clear and Coherent, Normal Rate Volume:Normal Handedness:Right  Mood and Affect  Mood:Anxious Affect:Congruent, Appropriate, Full Range  Thought Process  Thought Process:Coherent, Goal Directed Descriptions of Associations:Circumstantial  Thought Content Suicidal Thoughts:Suicidal Thoughts: No  (denied active and passive SI) Homicidal Thoughts:Homicidal Thoughts: No (denied active and passive HI) Hallucinations:Hallucinations: None, Visual (VH vs thought insertion about dreams from god warning her about the people who are bullying) Ideas of Reference:None Thought Content:Rumination, Perseveration, Delusions, Obsessions, Paranoid Ideation, Illogical  Sensorium  Memory:Immediate Fair, Recent Fair Judgment:Impaired Insight:None  Art therapist  Orientation:Full (Time, Place and Person) Language:Good Concentration:Fair Attention:Fair Recall:Good Fund of Knowledge:Good  Psychomotor Activity  Psychomotor Activity:Psychomotor Activity: Normal  Assets  Assets:Communication Skills, Desire for Improvement, Health and safety inspector, Housing, Physical Health, Resilience, Social Support, Talents/Skills, Transportation, Vocational/Educational  Sleep  Quality:Poor  COGNITIVE FEATURES THAT CONTRIBUTE TO RISK:  Closed-mindedness, Loss of executive function, Polarized thinking, and Thought constriction (tunnel vision)    SUICIDE RISK:  Severe: Frequent, intense, and enduring suicidal ideation, specific plan, no subjective intent, but some objective markers of intent (i.e., choice of lethal method), the method is accessible, some limited preparatory behavior, evidence of impaired self-control, severe dysphoria/symptomatology, multiple risk factors present, and few if any protective factors, particularly a lack of social support. Moderate:  Frequent suicidal ideation with limited intensity, and duration, some specificity in terms of plans, no associated intent, good self-control, limited dysphoria/symptomatology, some risk factors present, and identifiable protective factors, including available and accessible social support.   PLAN OF CARE:  Patient met requirement for inpatient psychiatric hospitalization and has been admitted.  See H&P & attending's attestation for detailed  plan.  I certify that inpatient services furnished can reasonably be expected to improve the patient's condition.  Total Time spent with patient:  See attending attestation  Signed: Princess Bruins, DO Psychiatry Resident, PGY-2 [facility] - Adult  [address] [place], [place] [address] Ph: [phone] Fax: [phone] 03/30/2023, 3:03 PM

## 2023-03-30 NOTE — Group Note (Signed)
Date:  03/30/2023 Time:  10:10 AM  Group Topic/Focus:  Goals Group:   The focus of this group is to help patients establish daily goals to achieve during treatment and discuss how the patient can incorporate goal setting into their daily lives to aide in recovery. Orientation:   The focus of this group is to educate the patient on the purpose and policies of crisis stabilization and provide a format to answer questions about their admission.  The group details unit policies and expectations of patients while admitted.    Participation Level:  Did Not Attend   Julie Coffey 03/30/2023, 10:10 AM

## 2023-03-30 NOTE — Progress Notes (Signed)
   Patient presented with moderate anxiety at the medication window.  Patient reported, "I usually don't take medications."  Writer explained medication.  Administered PRN Hydroxyzine per [facility] per patient request.

## 2023-03-30 NOTE — BHH Counselor (Signed)
Adult Comprehensive Assessment  Patient ID: Julie Coffey, female   DOB: [birth year], 42 y.o.   MRN: [id number]  Information Source: Information source: Patient  Current Stressors:  Patient states their primary concerns and needs for treatment are:: Pt states that she had increased stress after bullying at work and some stalking behavior from one of her neighbors. Patient states their goals for this hospitilization and ongoing recovery are:: Patient states that she needs assistance with her obsessive thoughts around the stressors in her environment Educational / Learning stressors: no stressors Employment / Job issues: Recently (April 3rd) quit her job due to bullying.  She states that they are retaliating due to her bringing it up to HR and that her boss is doing witchcraft on her Family Relationships: Patient has 2 daughters and family that live close and are supportive Financial / Lack of resources (include bankruptcy): Patient states that she spent a lot of money and was wreckless with it after she quit her job Housing / Lack of housing: Pt was living independently but recently her daughter moved back in because she was concerned about her mom Physical health (include injuries & life threatening diseases): no stresors Social relationships: no stressors Substance abuse: denies- 1 month ago stopped using marijuana Bereavement / Loss: sister passed away 2 years ago after seies of strokes  Living/Environment/Situation:  Living Arrangements: Alone Living conditions (as described by patient or guardian): patient states that she is harassed by her next door neighbor Who else lives in the home?: daughter How long has patient lived in current situation?: 1 year What is atmosphere in current home: Dangerous  Family History:  Marital status: Single Are you sexually active?: No Does patient have children?: Yes How many children?: 2 How is patient's relationship with their children?: two  daughters ages 58 and 17, has a close relationship with them  Childhood History:  By whom was/is the patient raised?: Both parents Additional childhood history information: Patient states that she had a hard childhood and that her mother was an addict.  She states that she raised herself Description of patient's relationship with caregiver when they were a child: not good Patient's description of current relationship with people who raised him/her: she has forgiven her mother and is working on a relationship with boundaries with her How were you disciplined when you got in trouble as a child/adolescent?: patient states she disciplined herself Does patient have siblings?: Yes Number of Siblings: 2 Description of patient's current relationship with siblings: sister passed away, brother she is close with Did patient suffer any verbal/emotional/physical/sexual abuse as a child?: Yes Did patient suffer from severe childhood neglect?: No Has patient ever been sexually abused/assaulted/raped as an adolescent or adult?: No Was the patient ever a victim of a crime or a disaster?: No Witnessed domestic violence?: No Has patient been affected by domestic violence as an adult?: No  Education:     Employment/Work Situation:   Employment Situation: Employed Where is Patient Currently Employed?: Patient currently works at  [employer] has Patient Been Employed?: 1 year Are You Satisfied With Your Job?: No Do You Work More Than One Job?: No Work Stressors: Pt states that she has rabbits that are following her from a previous job she was at that she was bullied at [employer] Job has Been Impacted by Current Illness: No Has Patient ever Been in the [employer]?: No  Financial Resources:   Financial resources: Income from employment  Alcohol/Substance Abuse:   What has been your  use of drugs/alcohol within the last 12 months?: none If attempted suicide, did drugs/alcohol play a role in this?:  No Alcohol/Substance Abuse Treatment Hx: Denies past history Has alcohol/substance abuse ever caused legal problems?: No  Social Support System:   Conservation officer, nature Support System: Fair Describe Community Support System: Family Type of faith/religion: Christian How does patient's faith help to cope with current illness?: read the bible, pray  Leisure/Recreation:   Do You Have Hobbies?: Yes Leisure and Hobbies: patient reports that she likes to read a lot, be outside and swing  Strengths/Needs:   What is the patient's perception of their strengths?: Patient states she is the Rock in the family Patient states they can use these personal strengths during their treatment to contribute to their recovery: yes Patient states these barriers may affect/interfere with their treatment: none Patient states these barriers may affect their return to the community: neighbors and witchcraft Other important information patient would like considered in planning for their treatment: none  Discharge Plan:   Currently receiving community mental health services: No Patient states concerns and preferences for aftercare planning are: none Patient states they will know when they are safe and ready for discharge when: patient states that she may be ready now, but needed to regroup Does patient have access to transportation?: Yes Does patient have financial barriers related to discharge medications?: No Will patient be returning to same living situation after discharge?: Yes  Summary/Recommendations:   Summary and Recommendations (to be completed by the evaluator): Julie Coffey  is a 42 year old female who was admitted to [facility] for stress around certain bizarre things that have happened since she quit her job.  She reports seeing bunnies that are connected to witchcraft that keep following her.  She also reports increased stress from a neighbor that harasses her.  She reports that her job caused her significant  stress and that she was bullied.  Patient reports that she spent frivolously after quitting her job in April.  She was connected to a counselor through her job but stopped seeing them 2 weeks ago.  She reports that they wanted her to see a psychiatrist but "I am not a medicine person."  Patient is open to getting connected to a new counselor.  While here, Julie Coffey  can benefit from crisis stabilization, medication management, therapeutic milieu, and referrals for services.   Julie Coffey E Zair Borawski. 03/30/2023

## 2023-03-30 NOTE — Progress Notes (Signed)
Pt in room the majority of the day. Pt is attending meals. Pt signed a 72 hour request for discharge at 3pm. Pt states she does not believe she needs to be hospitalized. Pt does acknowledge seeing brown bunnies for months "All the time, everywhere I go." Pt states she took pictures and videos on her phone. Pt believes her old Production designer, theatre/television/film is involved in witchraft and was bullying her while she was employed. Pt denies SI/HI/AVH. Pt states "The voice I hear is my own voice, my intuition, its not a hallucination." Q 15 minute checks ongoing.

## 2023-03-30 NOTE — BHH Suicide Risk Assessment (Signed)
BHH INPATIENT:  Family/Significant Other Suicide Prevention Education  Suicide Prevention Education:  Education Completed; Julie Coffey (daughter) [phone],  (name of family member/significant other) has been identified by the patient as the family member/significant other with whom the patient will be residing, and identified as the person(s) who will aid the patient in the event of a mental health crisis (suicidal ideations/suicide attempt).  With written consent from the patient, the family member/significant other has been provided the following suicide prevention education, prior to the and/or following the discharge of the patient.  CSW spoke with daughter who reports that mom became more fixated on brown rabbits but is not denying that they have been around.  She reports that the neighbor is a stalker and that that information is true.  She reports that mother has been under increased stress and she reports that she came to a breaking point. Daughter is making sure patient doesn't have access to guns/weapons. Daughter doesn't have any safety concerns for mother   The suicide prevention education provided includes the following: Suicide risk factors Suicide prevention and interventions National Suicide Hotline telephone number [facility] assessment telephone number [facility] Emergency Assistance 911 [facility] and/or Residential Mobile Crisis Unit telephone number  Request made of family/significant other to: Remove weapons (e.g., guns, rifles, knives), all items previously/currently identified as safety concern.   Remove drugs/medications (over-the-counter, prescriptions, illicit drugs), all items previously/currently identified as a safety concern.  The family member/significant other verbalizes understanding of the suicide prevention education information provided.  The family member/significant other agrees to remove the items of safety concern listed  above.  Julie Coffey E Julie Coffey 03/30/2023, 1:57 PM

## 2023-03-30 NOTE — Progress Notes (Signed)
   03/30/23 0536  15 Minute Checks  Location Bedroom  Visual Appearance Calm  Behavior Sleeping  Sleep (Behavioral Health Patients Only)  Calculate sleep? (Click Yes once per 24 hr at 0600 safety check) Yes  Documented sleep last 24 hours 10.25

## 2023-03-30 NOTE — Progress Notes (Signed)
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; left a stressful job, being bullied at work and someone there putting witchcraft on her)  Coping Skills:   Film/video editor, Journal, Music, Exercise, Deep Breathing, Meditate, Talk, Prayer, Avoidance, Read, Merchandiser, retail, Dance  Leisure Interests (2+):  Social - Family (spending time with great niece and nephew)  Frequency of Recreation/Participation: Weekly ("Now it's more every other week")  Awareness of Community Resources:  Yes  Community Resources:  Park, Other (Comment) (Museum, C-[address], Jumping places for kids)  Current Use: Yes  If no, Barriers?:    Expressed Interest in [employer] Information: No  County [place] Residence:  [place]  Patient Main Form of Transportation: Set designer  Patient Strengths:  Organized, Rational, Analytical  Patient Identified Areas of Improvement:  "stop being naive/ thinking just because I'm good, everyone else is to"  Patient Goal for Hospitalization:  "get over and through the things that are going on"  Current SI (including self-harm):  No  Current HI:  No  Current AVH: No  Staff Intervention Plan: Group Attendance, Collaborate with Interdisciplinary Treatment Team  Consent to Intern Participation: N/A   Tayte Mcwherter-McCall, LRT,CTRS Verneice Caspers A Pietro Bonura-McCall 03/30/2023, 11:51 AM

## 2023-03-31 DIAGNOSIS — F23 Brief psychotic disorder: Secondary | ICD-10-CM

## 2023-03-31 NOTE — Progress Notes (Signed)
Patient now on 300 hall. Is pleasant and talkative  and wanting to be d/c. Patient denies SI/ HI and AVH and denies depression but does endorse anxiety about needing to get back to the case against employer and neighbor. Patient refused labs this evening and states "I need to leave a bed open for people who actually need to be here, my daughter has correlated everything I've said."  Patient remains safe on Q 15 min safety checks.  03/31/23 1800  Psych Admission Type (Psych Patients Only)  Admission Status Voluntary  Psychosocial Assessment  Patient Complaints Worrying;Tension  Eye Contact Fair  Facial Expression Worried;Animated  Affect Preoccupied  Speech Logical/coherent;Rapid  Interaction Assertive  Motor Activity Other (Comment) (WDL)  Appearance/Hygiene Unremarkable  Behavior Characteristics Cooperative  Mood Preoccupied;Pleasant  Thought Process  Coherency WDL  Content Preoccupation;Paranoia  Delusions Paranoid  Perception WDL  Hallucination None reported or observed  Judgment Impaired  Confusion None  Danger to Self  Current suicidal ideation? Denies  Danger to Others  Danger to Others None reported or observed

## 2023-03-31 NOTE — Progress Notes (Signed)
[facility] MD Progress Note  03/31/2023 10:43 AM Julie Coffey  MRN:  [id number]  Subjective:   Julie Coffey is a 42 y.o., female with PAST PSYCHIATRIC HISTORY of MDD, GAD, with no suicide attempt, no inpt psych admission, who presented voluntary to [facility] (03/29/2023), then was transferred Voluntary to [facility] (03/29/2023) for evaluation and treatment of psychosis.  Yesterday the psychiatry team made the following recommendations: START risperdal 1 mg qHS   On my evaluation today, the patient reports that leading up to this admission she was sleep deprived.  She reports that she was sleep deprived because she is worried to sleep because during sleep she is vulnerable to being harmed by others.  She reports that she took the Risperdal last night and does not have any side effects from starting that medication.  Reports sleeping better last night but did wake up a few times due to the every 15 minute checks.  She denies that she is depressed or sad.  She reports that she is anxious, due to to being in the hospital and also due to the multiple stressors she has going on outside the hospital including being harmed by the witchcraft, being followed by the rabbit.  She denies any SI or HI.  She reports she did get the labs drawn for the workup of medical causes for psychosis. As the interview progressed, the patient became fixated on proving that the brown rabbit was following her in multiple cities.  Reports that she had googled this and this has happened to other people, specifically a brown rabbit following other people.  According to the daughter, the daughter has seen this rabbit per collateral.  Patient believes she is being kept in the hospital to run more test on her, and I explained to her that we are not discharging her at this time, as it is still unclear how severe her paranoia and delusions are, if they respond to treatment, or if the symptoms require a prolonged hospitalization; and  we are not keeping her in the hospital to run more labs on her.   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  She reports having multiple medical symptoms including heart palpitations and a clear fluid coming from her nose, they are due to witchcraft and spells put on her by her former executive team.   Next, the patient becomes very fixated on having 17 vials of blood drawn this morning.  I discussed with her that yesterday that she did agree to have labs drawn to see if there are any medical causes of her bizarre thinking, and she was agreeable to that at that point.  I did discuss with her yesterday, that she has the option to refuse lab work, and it would not affect her duration of hospitalization in a negative way.   As the interview continued to progress, the patient became very tearful and crying, and then very irritable with this Clinical research associate.  I did explain to her that at this time, I do not plan to contest the 72-hour for discharge, as and I am not at this time planning to ask the magistrate to involuntarily commit the patient, and that we will continue to observe her symptoms and response to treatment in the hospital.    Principal Problem: Schizophrenia (HCC) Diagnosis: Principal Problem:   Schizophrenia (HCC) Active Problems:   Brief psychotic disorder (HCC)  Total  Time spent with patient: 20 minutes  Past Psychiatric History:  Diagnoses: MDD, GAD Inpatient treatment: denies Suicide: denies Homicide: denies Medication history: ?prozac, paxil, zoloft, xanax.  Dispensed lamictal but never took Psychotherapy: Last time 2023 due to change in insurance  Past Medical History:  Past Medical History:  Diagnosis Date   Diabetes mellitus without complication (HCC)    HSV infection     Past Surgical History:  Procedure Laterality Date   ABDOMINAL HYSTERECTOMY     CESAREAN SECTION     x3    TERATOMA EXCISION     Family History: History reviewed. No pertinent family history.  Family Psychiatric  History:  BiPD: Denied SCzA/SCZ: ? Cousin, multiple family members on maternal and paternal side Psych Rx: Unsure, likely Suicide: N/A Homicide: N/A Inpatient psych: multiple family members on maternal and paternal side Substance use: mom - crack  Social History:  Social History   Substance and Sexual Activity  Alcohol Use Yes   Comment: occasionally     Social History   Substance and Sexual Activity  Drug Use Yes   Types: Marijuana    Social History   Socioeconomic History   Marital status: Single    Spouse name: Not on file   Number of children: Not on file   Years of education: Not on file   Highest education level: Not on file  Occupational History   Not on file  Tobacco Use   Smoking status: Former    Packs/day: .5    Types: Cigarettes   Smokeless tobacco: Never   Tobacco comments:    took chantix so smokes 1-2 cigs/day  Vaping Use   Vaping Use: Never used  Substance and Sexual Activity   Alcohol use: Yes    Comment: occasionally   Drug use: Yes    Types: Marijuana   Sexual activity: Not Currently    Birth control/protection: Surgical  Other Topics Concern   Not on file  Social History Narrative   Not on file   Social Determinants of Health   Financial Resource Strain: Not on file  Food Insecurity: Patient Declined (03/29/2023)   Hunger Vital Sign    Worried About Running Out of Food in the Last Year: Patient declined    Ran Out of Food in the Last Year: Patient declined  Transportation Needs: No Transportation Needs (03/29/2023)   PRAPARE - Administrator, Civil Service (Medical): No    Lack of Transportation (Non-Medical): No  Physical Activity: Not on file  Stress: Not on file  Social Connections: Not on file   Additional Social History:                         Sleep: Fair  Appetite:  Fair  Current  Medications: Current Facility-Administered Medications  Medication Dose Route Frequency Provider Last Rate Last Admin   alum & mag hydroxide-simeth (MAALOX/MYLANTA) 200-200-20 MG/5ML suspension 30 mL  30 mL Oral Q6H PRN Bing Neighbors, NP       folic acid (FOLVITE) tablet 1 mg  1 mg Oral QHS Princess Bruins, DO       hydrOXYzine (ATARAX) tablet 25 mg  25 mg Oral TID PRN Bing Neighbors, NP   25 mg at 03/29/23 2048   ibuprofen (ADVIL) tablet 600 mg  600 mg Oral Q8H PRN Bing Neighbors, NP       LORazepam (ATIVAN) tablet 2 mg  2 mg Oral Q6H PRN Joaquin Courts  S, NP       multivitamin with minerals tablet 1 tablet  1 tablet Oral QHS Princess Bruins, DO       ondansetron (ZOFRAN-ODT) disintegrating tablet 4 mg  4 mg Oral Q8H PRN Bing Neighbors, NP       risperiDONE (RISPERDAL M-TABS) disintegrating tablet 1 mg  1 mg Oral QHS Princess Bruins, DO       thiamine (Vitamin B-1) tablet 100 mg  100 mg Oral QHS Princess Bruins, DO       traZODone (DESYREL) tablet 50 mg  50 mg Oral QHS PRN Princess Bruins, DO        Lab Results: No results found for this or any previous visit (from the past 48 hour(s)).  Blood Alcohol level:  Lab Results  Component Value Date   ETH <10 03/29/2023    Metabolic Disorder Labs: Lab Results  Component Value Date   HGBA1C 5.6 06/06/2019   No results found for: "PROLACTIN" Lab Results  Component Value Date   CHOL 189 08/19/2018   TRIG 69 08/19/2018   HDL 49 08/19/2018   CHOLHDL 3.9 08/19/2018   LDLCALC 126 (H) 08/19/2018    Physical Findings: AIMS: Facial and Oral Movements Muscles of Facial Expression: None, normal Lips and Perioral Area: None, normal Jaw: None, normal Tongue: None, normal,Extremity Movements Upper (arms, wrists, hands, fingers): None, normal Lower (legs, knees, ankles, toes): None, normal, Trunk Movements Neck, shoulders, hips: None, normal, Overall Severity Severity of abnormal movements (highest score from questions above): None,  normal Incapacitation due to abnormal movements: None, normal Patient's awareness of abnormal movements (rate only patient's report): No Awareness, Dental Status Current problems with teeth and/or dentures?: No Does patient usually wear dentures?: No  CIWA:    COWS:     Musculoskeletal: Strength & Muscle Tone: within normal limits Gait & Station: normal Patient leans: N/A  Psychiatric Specialty Exam:  Presentation  General Appearance:  Casual; Fairly Groomed  Eye Contact: Good  Speech: -- (RAPID BUT NOT PRESSURED)  Speech Volume: Increased  Handedness: Right   Mood and Affect  Mood: Anxious  Affect: Full Range   Thought Process  Thought Processes: Linear  Descriptions of Associations:Intact  Orientation:Full (Time, Place and Person)  Thought Content:-- (It is difficult to determine if patient has any illogical thinking, delusions or paranoia, as she provides rationalization for most of her odd bizarre experiences.)  History of Schizophrenia/Schizoaffective disorder:No  Duration of Psychotic Symptoms:Greater than six months  Hallucinations:Hallucinations: None  Ideas of Reference:-- (Concern for paranoia and delusions)  Suicidal Thoughts:Suicidal Thoughts: No  Homicidal Thoughts:Homicidal Thoughts: No   Sensorium  Memory: Immediate Good; Recent Good; Remote Good  Judgment: Fair  Insight: Lacking   Executive Functions  Concentration: Good  Attention Span: Good  Recall: Good  Fund of Knowledge: Good  Language: Good   Psychomotor Activity  Psychomotor Activity: Psychomotor Activity: Normal   Assets  Assets: Communication Skills; Desire for Improvement; Financial Resources/Insurance; Housing; Physical Health; Resilience; Social Support; Talents/Skills; Transportation; Vocational/Educational   Sleep  Sleep: Sleep: Fair    Physical Exam: Physical Exam Vitals reviewed.  Constitutional:      General: She is not in  acute distress.    Appearance: She is not toxic-appearing.  Pulmonary:     Effort: Pulmonary effort is normal.  Neurological:     Mental Status: She is alert.     Motor: No weakness.     Gait: Gait normal.  Psychiatric:  Behavior: Behavior normal.        Judgment: Judgment normal.    Review of Systems  Constitutional:  Negative for chills and fever.  Cardiovascular:  Negative for chest pain and palpitations.  Neurological:  Negative for dizziness, tingling, tremors and headaches.  Psychiatric/Behavioral:  Negative for depression, hallucinations, memory loss, substance abuse and suicidal ideas. The patient is nervous/anxious. The patient does not have insomnia.   All other systems reviewed and are negative.  Blood pressure 117/84, pulse (!) 128, temperature 97.8 F (36.6 C), temperature source Oral, resp. rate 18, height 5\' 8"  (1.727 m), weight 98.4 kg, SpO2 100 %. Body mass index is 32.99 kg/m.   Treatment Plan Summary: Daily contact with patient to assess and evaluate symptoms and progress in treatment and Medication management   ASSESSMENT:  Diagnoses / Active Problems: R/o Schizophrenia   It is still my thinking that the patient is experiencing paranoid and delusional thoughts.  It seems to me that the patient is over attributing importance to real life events that are occurring.  It is possible, and I explained to the patient that it is possible, that everything she is saying is true including the witchcraft, spells, brown rabbit following her, unexplained medical symptoms that are occurring, are not due to psychosis.  I again discussed with her, that if what she experiencing is not due to psychosis, the antipsychotic will not affect those experiences.  At this time I am not contesting the 72-hour request for discharge, and patient will remain in the hospital as we observe her symptoms and response to treatment.  The patient states she did "get 17 vials of blood drawn"  from her this morning.  But in the computer it does not show that any of the labs are in process.  Follow-up these results if the patient did get the labs drawn.   PLAN: Safety and Monitoring:  --  Voluntary admission to inpatient psychiatric unit for safety, stabilization and treatment  -Pt signed 72 hr request for dc on 6-7 at 3pm    -- Daily contact with patient to assess and evaluate symptoms and progress in treatment  -- Patient's case to be discussed in multi-disciplinary team meeting  -- Observation Level : q15 minute checks  -- Vital signs:  q12 hours  -- Precautions: suicide, elopement, and assault  2. Psychiatric Diagnoses and Treatment:    -continue Risperdal 1 mg nightly.  For psychosis.  For some reason, the patient only received 0.5 mg of Risperdal last night, nothing was reported from the nursing team about this, and nothing is documented in the chart as to why the patient did not receive 1 mg of Risperdal last night.  --  The risks/benefits/side-effects/alternatives to this medication were discussed in detail with the patient and time was given for questions. The patient consents to medication trial.    -- Metabolic profile and EKG monitoring obtained while on an atypical antipsychotic (BMI: Lipid Panel: HbgA1c: QTc:)   -- Encouraged patient to participate in unit milieu and in scheduled group therapies   -- Short Term Goals: Ability to identify changes in lifestyle to reduce recurrence of condition will improve, Ability to verbalize feelings will improve, Ability to disclose and discuss suicidal ideas, Ability to demonstrate self-control will improve, Ability to identify and develop effective coping behaviors will improve, Ability to maintain clinical measurements within normal limits will improve, Compliance with prescribed medications will improve, and Ability to identify triggers associated with substance abuse/mental health issues will improve  --  Long Term Goals:  Improvement in symptoms so as ready for discharge    3. Medical Issues Being Addressed:     4. Discharge Planning:   -- Social work and case management to assist with discharge planning and identification of hospital follow-up needs prior to discharge  -- Estimated LOS: 3-4 more days  -- Discharge Concerns: Need to establish a safety plan; Medication compliance and effectiveness  -- Discharge Goals: Return home with outpatient referrals for mental health follow-up including medication management/psychotherapy     Cristy Hilts, MD 03/31/2023, 10:43 AM   Total Time Spent in Direct Patient Care:  I personally spent 45 minutes on the unit in direct patient care. The direct patient care time included face-to-face time with the patient, reviewing the patient's chart, communicating with other professionals, and coordinating care. Greater than 50% of this time was spent in counseling or coordinating care with the patient regarding goals of hospitalization, psycho-education, and discharge planning needs.   Phineas Inches, MD Psychiatrist

## 2023-03-31 NOTE — Group Note (Signed)
Date:  03/31/2023 Time:  6:14 PM  Group Topic/Focus:  Support and Check-in-Open Discussion and Journaling    Participation Level:  Did Not Attend  Participation Quality:   n/a  Affect:   n/a  Cognitive:   n/a  Insight: None  Engagement in Group:   n/a  Modes of Intervention:   n/a  Additional Comments:   Pt did not attend.  Edmund Hilda Corinne Goucher 03/31/2023, 6:14 PM

## 2023-03-31 NOTE — Progress Notes (Signed)
   03/31/23 0558  15 Minute Checks  Location Bedroom  Visual Appearance Calm  Behavior Sleeping  Sleep (Behavioral Health Patients Only)  Calculate sleep? (Click Yes once per 24 hr at 0600 safety check) Yes  Documented sleep last 24 hours 10.5

## 2023-03-31 NOTE — Group Note (Signed)
Date:  03/31/2023 Time:  4:46 PM  Group Topic/Focus:  Dimensions of Wellness:   The focus of this group is to introduce the topic of wellness and discuss the role each dimension of wellness plays in total health.    Participation Level:  Did Not Attend  Participation Quality:   n/a  Affect:   n/a  Cognitive:   n/a  Insight: None  Engagement in Group:   n/a  Modes of Intervention:   n/a  Additional Comments:   Pt did not attend.  Edmund Hilda Debara Kamphuis 03/31/2023, 4:46 PM

## 2023-03-31 NOTE — Group Note (Signed)
Date:  03/31/2023 Time:  4:26 PM  Group Topic/Focus:  Goals Group:   The focus of this group is to help patients establish daily goals to achieve during treatment and discuss how the patient can incorporate goal setting into their daily lives to aide in recovery. Orientation:   The focus of this group is to educate the patient on the purpose and policies of crisis stabilization and provide a format to answer questions about their admission.  The group details unit policies and expectations of patients while admitted.    Participation Level:  Did Not Attend  Participation Quality:   n/a  Affect:   n/a  Cognitive:   n/a  Insight: None  Engagement in Group:   n/a  Modes of Intervention:   n/a  Additional Comments:   Pt did not attend.  Julie Coffey 03/31/2023, 4:26 PM

## 2023-03-31 NOTE — Progress Notes (Signed)
   03/30/23 2130  Psych Admission Type (Psych Patients Only)  Admission Status Voluntary  Psychosocial Assessment  Patient Complaints Worrying;Anxiety  Eye Contact Brief  Facial Expression Anxious  Affect Appropriate to circumstance;Anxious  Speech Logical/coherent  Interaction No initiation  Motor Activity Slow  Appearance/Hygiene Unremarkable  Behavior Characteristics Appropriate to situation  Mood Pleasant;Preoccupied  Thought Process  Coherency WDL  Content Preoccupation;Paranoia  Delusions Paranoid  Perception Hallucinations  Hallucination Visual  Judgment Impaired  Confusion None  Danger to Self  Current suicidal ideation? Denies

## 2023-03-31 NOTE — Group Note (Addendum)
Date:  03/31/2023 Time:  9:37 PM  Pt attended and participated in wrap up group this evening, but opted not to participate.       Julie Coffey 03/31/2023, 9:37 PM

## 2023-04-01 DIAGNOSIS — F23 Brief psychotic disorder: Secondary | ICD-10-CM

## 2023-04-01 DIAGNOSIS — F22 Delusional disorders: Principal | ICD-10-CM | POA: Insufficient documentation

## 2023-04-01 NOTE — Progress Notes (Signed)
D: Pt alert and oriented.  Pt denies experiencing any SI/HI, or AVH at this time. She avoids interaction and want to go home is the only thing that she endorses.  A:  Refused night time medications  Routine safety checks conducted q15 minutes.   R: No adverse drug reactions noted. Pt verbally contracts for safety at this time. Pt is not complaint with medications and treatment plan. . Pt remains safe at this time. Will continue to monitor.  04/01/23 0000  Psych Admission Type (Psych Patients Only)  Admission Status Voluntary  Psychosocial Assessment  Patient Complaints Other (Comment) (Wants to go home)  Eye Contact Fair  Facial Expression Anxious  Affect Irritable  Speech Logical/coherent  Interaction Minimal  Motor Activity Other (Comment) (wdl)  Appearance/Hygiene Unremarkable  Behavior Characteristics Guarded  Mood Depressed  Thought Process  Coherency WDL  Content UTA  Delusions Paranoid  Perception WDL  Hallucination None reported or observed  Judgment Impaired  Confusion None  Danger to Self  Current suicidal ideation? Denies  Danger to Others  Danger to Others None reported or observed

## 2023-04-01 NOTE — Discharge Summary (Signed)
Physician Discharge Summary Note  Patient:  Julie Coffey is an 42 y.o., female MRN:  [id number] DOB:  1980/12/06 Patient phone:  [phone] (home)  Patient address:   [address] [place] [address],  Total Time spent with patient: 20 minutes  Date of Admission:  03/29/2023 Date of Discharge: 04-01-2023  Reason for Admission:   Julie Coffey is a 42 y.o., female with PMH of MDD, GAD, no suicide attempt, no inpt psych admission, who presented voluntary to [facility] (03/29/2023), then transferred Voluntary to [facility] (03/29/2023) for evaulation of concern for psychosis (delusions and paranoia)  Principal Problem: Paranoia (HCC) Discharge Diagnoses: Principal Problem:   Paranoia (HCC)   Past Psychiatric History:  Diagnoses: MDD, GAD Inpatient treatment: denies Suicide: denies Homicide: denies Medication history: ?prozac, paxil, zoloft, xanax.  Dispensed lamictal but never took Psychotherapy: Last time 2023 due to change in insurance  Past Medical History:  Past Medical History:  Diagnosis Date   Diabetes mellitus without complication (HCC)    HSV infection     Past Surgical History:  Procedure Laterality Date   ABDOMINAL HYSTERECTOMY     CESAREAN SECTION     x3   TERATOMA EXCISION     Family History: History reviewed. No pertinent family history.  Family Psychiatric  History:  BiPD: Denied SCzA/SCZ: ? Cousin, multiple family members on maternal and paternal side Psych Rx: Unsure, likely Suicide: N/A Homicide: N/A Inpatient psych: multiple family members on maternal and paternal side Substance use: mom - crack  Social History:  Social History   Substance and Sexual Activity  Alcohol Use Yes   Comment: occasionally     Social History   Substance and Sexual Activity  Drug Use Yes   Types: Marijuana    Social History   Socioeconomic History   Marital status: Single    Spouse name: Not on file   Number of children: Not on file    Years of education: Not on file   Highest education level: Not on file  Occupational History   Not on file  Tobacco Use   Smoking status: Former    Packs/day: .5    Types: Cigarettes   Smokeless tobacco: Never   Tobacco comments:    took chantix so smokes 1-2 cigs/day  Vaping Use   Vaping Use: Never used  Substance and Sexual Activity   Alcohol use: Yes    Comment: occasionally   Drug use: Yes    Types: Marijuana   Sexual activity: Not Currently    Birth control/protection: Surgical  Other Topics Concern   Not on file  Social History Narrative   Not on file   Social Determinants of Health   Financial Resource Strain: Not on file  Food Insecurity: Patient Declined (03/29/2023)   Hunger Vital Sign    Worried About Running Out of Food in the Last Year: Patient declined    Ran Out of Food in the Last Year: Patient declined  Transportation Needs: No Transportation Needs (03/29/2023)   PRAPARE - Administrator, Civil Service (Medical): No    Lack of Transportation (Non-Medical): No  Physical Activity: Not on file  Stress: Not on file  Social Connections: Not on file    Hospital Course:   During the patient's hospitalization, patient had extensive initial psychiatric evaluation, and follow-up psychiatric evaluations every day.  Psychiatric diagnoses provided upon initial assessment:  R/o Unspecified psychotic disorder   Patient's psychiatric medications were adjusted on admission:  -risperdal  1 mg qhs (received 0.5 mg on first night of admission. Refused 1 mg dose on night 2 of admission)  During the hospitalization, other adjustments were made to the patient's psychiatric medication regimen: none  Patient's care was discussed during the interdisciplinary team meeting every day during the hospitalization.  The patient denied having side effects to prescribed psychiatric medication.  Gradually, patient started adjusting to milieu. The patient was evaluated each  day by a clinical provider to ascertain response to treatment. Patient was asked each day to complete a self inventory noting mood, mental status, pain, new symptoms, anxiety and concerns.    On day of discharge, the patient reports that their mood is stable. The patient denied having suicidal thoughts for more than 48 hours prior to discharge.  Patient denies having homicidal thoughts.  Patient denies having auditory hallucinations.  Patient denies any visual hallucinations. Pt still exhibits what could be paranoid and delusional thoughts, but she is able to care for self, complete all adls, iadls.   Supportive psychotherapy was provided to the patient. The patient also participated in regular group therapy while hospitalized. Coping skills, problem solving as well as relaxation therapies were also part of the unit programming.  Pt refused first episode psychosis work-up labs.   The patient is able to verbalize their individual safety plan to this provider.  # It is recommended to the patient to continue psychiatric medications as prescribed, after discharge from the hospital.    # It is recommended to the patient to follow up with your outpatient psychiatric provider and PCP.  # It was discussed with the patient, the impact of alcohol, drugs, tobacco have been there overall psychiatric and medical wellbeing, and total abstinence from substance use was recommended the patient.ed.  # Prescriptions provided or sent directly to preferred pharmacy at discharge. Patient agreeable to plan. Given opportunity to ask questions. Appears to feel comfortable with discharge.    # In the event of worsening symptoms, the patient is instructed to call the crisis hotline, 911 and or go to the nearest ED for appropriate evaluation and treatment of symptoms. To follow-up with primary care provider for other medical issues, concerns and or health care needs  # Patient was discharged to home, with a plan to follow  up as noted below.   Physical Findings: AIMS: Facial and Oral Movements Muscles of Facial Expression: None, normal Lips and Perioral Area: None, normal Jaw: None, normal Tongue: None, normal,Extremity Movements Upper (arms, wrists, hands, fingers): None, normal Lower (legs, knees, ankles, toes): None, normal, Trunk Movements Neck, shoulders, hips: None, normal, Overall Severity Severity of abnormal movements (highest score from questions above): None, normal Incapacitation due to abnormal movements: None, normal Patient's awareness of abnormal movements (rate only patient's report): No Awareness, Dental Status Current problems with teeth and/or dentures?: No Does patient usually wear dentures?: No  CIWA:    COWS:     Aims score zero on my exam. No eps on my exam.  Musculoskeletal: Strength & Muscle Tone: within normal limits Gait & Station: normal Patient leans: N/A   Psychiatric Specialty Exam:  Presentation  General Appearance:  Appropriate for Environment; Casual; Fairly Groomed  Eye Contact: Good  Speech: Normal Rate; Clear and Coherent  Speech Volume: Normal  Handedness: Right   Mood and Affect  Mood: Euthymic; Anxious  Affect: Congruent; Full Range; Appropriate   Thought Process  Thought Processes: Linear  Descriptions of Associations:Intact  Orientation:Full (Time, Place and Person)  Thought Content:-- (It  is difficult to determine if patient has any illogical thinking, delusions or paranoia, as she provides rationalization for most of her odd bizarre experiences.)  History of Schizophrenia/Schizoaffective disorder:No  Duration of Psychotic Symptoms:Less than six months  Hallucinations:Hallucinations: None  Ideas of Reference:-- (Concern for paranoia and delusions)  Suicidal Thoughts:Suicidal Thoughts: No  Homicidal Thoughts:Homicidal Thoughts: No   Sensorium  Memory: Immediate Good; Recent Good; Remote  Good  Judgment: Good  Insight: Fair   Art therapist  Concentration: Fair  Attention Span: Fair  Recall: Good  Fund of Knowledge: Good  Language: Good   Psychomotor Activity  Psychomotor Activity: Psychomotor Activity: Normal   Assets  Assets: Communication Skills; Desire for Improvement; Financial Resources/Insurance; Housing; Physical Health; Resilience; Social Support; Talents/Skills; Transportation; Vocational/Educational   Sleep  Sleep: Sleep: Fair    Physical Exam: Physical Exam Vitals reviewed.  Constitutional:      General: She is not in acute distress.    Appearance: She is not toxic-appearing.  Pulmonary:     Effort: Pulmonary effort is normal.  Neurological:     Mental Status: She is alert.     Motor: No weakness.     Gait: Gait normal.  Psychiatric:        Mood and Affect: Mood normal.        Behavior: Behavior normal.        Judgment: Judgment normal.    Review of Systems  Constitutional:  Negative for chills and fever.  Cardiovascular:  Negative for chest pain and palpitations.  Neurological:  Negative for dizziness, tingling, tremors and headaches.  Psychiatric/Behavioral:  Negative for depression, hallucinations, memory loss, substance abuse and suicidal ideas. The patient is not nervous/anxious and does not have insomnia.   All other systems reviewed and are negative.  Blood pressure 128/81, pulse (!) 129, temperature 98.2 F (36.8 C), temperature source Oral, resp. rate 18, height 5\' 8"  (1.727 m), weight 98.4 kg, SpO2 100 %. Body mass index is 32.99 kg/m.   Social History   Tobacco Use  Smoking Status Former   Packs/day: .5   Types: Cigarettes  Smokeless Tobacco Never  Tobacco Comments   took chantix so smokes 1-2 cigs/day   Tobacco Cessation:  Prescription not provided because: pt declined nicotine replacement   Blood Alcohol level:  Lab Results  Component Value Date   ETH <10 03/29/2023    Metabolic  Disorder Labs:  Lab Results  Component Value Date   HGBA1C 5.6 06/06/2019   No results found for: "PROLACTIN" Lab Results  Component Value Date   CHOL 189 08/19/2018   TRIG 69 08/19/2018   HDL 49 08/19/2018   CHOLHDL 3.9 08/19/2018   LDLCALC 126 (H) 08/19/2018    See Psychiatric Specialty Exam and Suicide Risk Assessment completed by Attending Physician prior to discharge.  Discharge destination:  Home  Is patient on multiple antipsychotic therapies at discharge:  No   Has Patient had three or more failed trials of antipsychotic monotherapy by history:  No  Recommended Plan for Multiple Antipsychotic Therapies: NA  Discharge Instructions     Diet - low sodium heart healthy   Complete by: As directed    Increase activity slowly   Complete by: As directed       Allergies as of 04/01/2023   No Known Allergies      Medication List     TAKE these medications      Indication  Ozempic (0.25 or 0.5 MG/DOSE) 2 MG/3ML Sopn Generic drug: Semaglutide(0.25 or  0.5MG /DOS) Inject 0.5 mg into the skin once a week. Sunday  Indication: Type 2 Diabetes   valACYclovir 500 MG tablet Commonly known as: VALTREX Take 1,000 mg by mouth daily as needed. For flare-up only  Indication: home medication        Follow-up Information     [facility]. Call.   Why: Hours: Mon-Fri: 8AM to 5PM  Phone: [phone] Fax: [phone] Contact information: [address], [place], [place] [address]                Follow-up recommendations:    Activity: as tolerated  Diet: heart healthy  Other: -Follow-up with your outpatient psychiatric provider or therapist ([facility] in [place] about walk in hours or intake)  -Follow-up with outpatient primary care doctor and other specialists -for management of preventative medicine and chronic medical disease  -Recommend total abstinence from alcohol, tobacco, and other illicit drug use at discharge.   -If your  psychiatric symptoms recur, worsen, or if you have side effects to your psychiatric medications, call your outpatient psychiatric provider, 911, [phone] or go to the nearest emergency department.  -If suicidal thoughts occur, immediately call your outpatient psychiatric provider, 911, 988 or go to the nearest emergency department.   Signed: Cristy Hilts, MD 04/01/2023, 7:59 AM  Total Time Spent in Direct Patient Care:  I personally spent 35 minutes on the unit in direct patient care. The direct patient care time included face-to-face time with the patient, reviewing the patient's chart, communicating with other professionals, and coordinating care. Greater than 50% of this time was spent in counseling or coordinating care with the patient regarding goals of hospitalization, psycho-education, and discharge planning needs.   Phineas Inches, MD Psychiatrist

## 2023-04-01 NOTE — Progress Notes (Signed)
  [facility] Adult Case Management Discharge Plan :  Will you be returning to the same living situation after discharge:  Yes,  patient will be returning home with daughter At discharge, do you have transportation home?: Yes,  Patient's daughter will provide transport home Do you have the ability to pay for your medications: Yes,  Patient's has insurance to cover cost of medications  Release of information consent forms completed and in the chart;  Patient's signature needed at discharge.  Patient to Follow up at:  Follow-up Information     [facility]. Call.   Why: Hours: Mon-Fri: 8AM to 5PM  Phone: [phone] Fax: [phone] Contact information: [address], [place], [place] [address]                Next level of care provider has access to [facility] Link:no  Safety Planning and Suicide Prevention discussed: Yes,  completed with daughter, Agustina Caroli     Has patient been referred to the Quitline?: Patient does not use tobacco/nicotine products  Patient has been referred for addiction treatment: No known substance use disorder.          [address] [place], [place] 04/01/2023, 10:02 AM

## 2023-04-01 NOTE — Progress Notes (Signed)
D:  Patient denied SI and HI, contracts for safety.  Denied A/V hallucinations.  Denied pain. A:  No medications scheduled this morning.  Emotional support and encouragement given patient.   R:  Safety maintained with 15 minute checks.  

## 2023-04-01 NOTE — Plan of Care (Signed)
Nurse discussed anxiety, depression and coping skills with patient.  

## 2023-04-01 NOTE — [facility] Suicide Risk Assessment (Signed)
[facility] Discharge Suicide Risk Assessment   Principal Problem: Paranoia [facility]) Discharge Diagnoses: Principal Problem:   Paranoia (HCC)   Total Time spent with patient: 20 minutes Julie Coffey is a 42 y.o., female with PMH of MDD, GAD, no suicide attempt, no inpt psych admission, who presented voluntary to [facility] (03/29/2023), then transferred Voluntary to [facility] (03/29/2023) for evaulation of concern for psychosis (delusions and paranoia).   Psychiatric Specialty Exam  Presentation  General Appearance:  Appropriate for Environment; Casual; Fairly Groomed  Eye Contact: Good  Speech: Normal Rate; Clear and Coherent  Speech Volume: Normal  Handedness: Right   Mood and Affect  Mood: Euthymic; Anxious  Duration of Depression Symptoms: Greater than two weeks  Affect: Congruent; Full Range; Appropriate   Thought Process  Thought Processes: Linear  Descriptions of Associations:Intact  Orientation:Full (Time, Place and Person)  Thought Content:-- (It is difficult to determine if patient has any illogical thinking, delusions or paranoia, as she provides rationalization for most of her odd bizarre experiences.)  History of Schizophrenia/Schizoaffective disorder:No  Duration of Psychotic Symptoms:Less than six months  Hallucinations:Hallucinations: None  Ideas of Reference:-- (Concern for paranoia and delusions)  Suicidal Thoughts:Suicidal Thoughts: No  Homicidal Thoughts:Homicidal Thoughts: No   Sensorium  Memory: Immediate Good; Recent Good; Remote Good  Judgment: Good  Insight: Fair   Art therapist  Concentration: Fair  Attention Span: Fair  Recall: Good  Fund of Knowledge: Good  Language: Good   Psychomotor Activity  Psychomotor Activity: Psychomotor Activity: Normal   Assets  Assets: Communication Skills; Desire for Improvement; Financial Resources/Insurance; Housing; Physical Health; Resilience; Social  Support; Talents/Skills; Transportation; Vocational/Educational   Sleep  Sleep: Sleep: Fair   Physical Exam: Physical Exam See discharge summary  ROS See discharge summary   Blood pressure 128/81, pulse (!) 129, temperature 98.2 F (36.8 C), temperature source Oral, resp. rate 18, height 5\' 8"  (1.727 m), weight 98.4 kg, SpO2 100 %. Body mass index is 32.99 kg/m.  Mental Status Per Nursing Assessment::   On Admission:  NA  Demographic factors:  Loss Factors: Financial problems / change in socioeconomic status, change in vocational status Historical Factors: NA Risk Reduction Factors: Positive social support  Continued Clinical Symptoms:  Denies any mood symptoms. Denies SI.   Cognitive Features That Contribute To Risk:  None    Suicide Risk:  Minimal: No identifiable suicidal ideation.  Patients presenting with no risk factors but with morbid ruminations; may be classified as minimal risk based on the severity of the depressive symptoms    Follow-up Information     [facility]. Call.   Why: Hours: Mon-Fri: 8AM to 5PM  Phone: [phone] Fax: [phone] Contact information: [address] [place], [place] [address]                Plan Of Care/Follow-up recommendations:   -Follow-up with your outpatient psychiatric provider or therapist ([facility] in [place] about walk in hours or intake)   -Follow-up with outpatient primary care doctor and other specialists -for management of preventative medicine and chronic medical disease   -Recommend total abstinence from alcohol, tobacco, and other illicit drug use at discharge.    -If your psychiatric symptoms recur, worsen, or if you have side effects to your psychiatric medications, call your outpatient psychiatric provider, 911, [phone] or go to the nearest emergency department.   -If suicidal thoughts occur, immediately call your outpatient psychiatric provider, 911, 988 or go to the nearest  emergency department.  Cristy Hilts, MD 04/01/2023, 8:18 AM

## 2023-04-01 NOTE — Discharge Instructions (Addendum)
-  Follow-up with your outpatient psychiatric provider and or therapist  -instructions on appointment date, time, and address (location) are provided to you in discharge paperwork.  -Follow-up with outpatient primary care doctor and other specialists -for management of preventative medicine and any chronic medical disease.  -Recommend abstinence from alcohol, tobacco, and other illicit drug use at discharge.   -If your psychiatric symptoms recur, worsen, or if you have side effects to your psychiatric medications, call your outpatient psychiatric provider, 911, 988 or go to the nearest emergency department.  -If suicidal thoughts occur, call your outpatient psychiatric provider, 911, 988 or go to the nearest emergency department.

## 2023-04-01 NOTE — Progress Notes (Signed)
Discharge:  Patient discharged home with daughter.  Suicide prevention information given and discussed with patient who stated she understood and had no questions.  Denied SI and HI.  Denied A/V hallucinations.  Denied pain.  Patient stated she received all her belongings.  Patient stated she appreciated all assistance received from [facility] staff.  All required discharge information given.

## 2023-04-08 ENCOUNTER — Emergency Department (HOSPITAL_COMMUNITY)
Admission: EM | Admit: 2023-04-08 | Discharge: 2023-04-08 | Disposition: A | Discharge status: Home / Self Care | Payer: Medicaid Other | Attending: Emergency Medicine | Admitting: Emergency Medicine

## 2023-04-08 ENCOUNTER — Other Ambulatory Visit: Payer: Self-pay

## 2023-04-08 ENCOUNTER — Encounter (HOSPITAL_COMMUNITY): Payer: Self-pay | Admitting: Emergency Medicine

## 2023-04-08 DIAGNOSIS — Z7984 Long term (current) use of oral hypoglycemic drugs: Secondary | ICD-10-CM | POA: Insufficient documentation

## 2023-04-08 DIAGNOSIS — G4710 Hypersomnia, unspecified: Secondary | ICD-10-CM | POA: Insufficient documentation

## 2023-04-08 DIAGNOSIS — R400 Somnolence: Secondary | ICD-10-CM | POA: Diagnosis present

## 2023-04-08 DIAGNOSIS — E119 Type 2 diabetes mellitus without complications: Secondary | ICD-10-CM | POA: Diagnosis not present

## 2023-04-08 NOTE — ED Triage Notes (Addendum)
Pt in with extreme sleepiness worsened over the past month. Pt states she sleeps all day, often drifts off to sleep while driving, has been tested for narcolepsy but results are negative for this. Pt states she has been taking NoDoz 200mg  tabs q4h and still falls asleep constantly. States she feels a lot of nasal pressure, and feels like clear brain fluid is leaking out of her L nostril at times.

## 2023-04-08 NOTE — ED Provider Notes (Signed)
[place] EMERGENCY DEPARTMENT AT [facility] Provider Note   CSN: [id number] Arrival date & time: 04/08/23  2130     History  Chief Complaint  Patient presents with   Drowsiness    Julie Coffey is a 42 y.o. female.  The history is provided by the patient.  Patient with history of diabetes presents with hypersomnia.  She reports extreme sleepiness for several months.  She reports extensive evaluation in an outside hospital of unclear etiology.  She reports yesterday she noted clear discharge from her left nose which made her concerned.  No new headache or visual changes.  No recent trauma.  No fevers or vomiting.  No new weakness.  She reports she must take NoDoz just to stay awake She does not take any sedating medicines She is concerned because she might fall asleep while driving. She reports she was recommended to take antidepressants but she declined to take these.  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    Past Medical History:  Diagnosis Date   Diabetes mellitus without complication (HCC)    HSV infection     Home Medications Prior to Admission medications   Medication Sig Start Date End Date Taking? Authorizing Provider  Semaglutide,0.25 or 0.5MG /DOS, (OZEMPIC, 0.25 OR 0.5 MG/DOSE,) 2 MG/3ML SOPN Inject 0.5 mg into the skin once a week. Sunday    [provider]  valACYclovir (VALTREX) 500 MG tablet Take 1,000 mg by mouth daily as needed. For flare-up only    [provider]      Allergies    Patient has no known allergies.    Review of Systems   Review of Systems  Constitutional:  Negative for fever.  Eyes:  Negative for visual disturbance.  Neurological:  Negative for headaches.    Physical Exam Updated Vital Signs BP (!) 118/107   Pulse 96   Temp 98.2 F (36.8 C) (Oral)   Resp 18   Wt 98 kg   SpO2 100%    BMI 32.84 kg/m  Physical Exam CONSTITUTIONAL: Well developed/well nourished HEAD: Normocephalic/atraumatic EYES: EOMI/PERRL, no nystagmus, no ptosis ENMT: Mucous membranes moist No facial tenderness or swelling.  The nose is unremarkable in appearance, no signs of epistaxis or trauma, no fluid is noted in either nare NECK: supple no meningeal signs, no bruits CV: S1/S2 noted, no murmurs/rubs/gallops noted LUNGS: Lungs are clear to auscultation bilaterally, no apparent distress ABDOMEN: soft, nontender, no rebound or guarding GU:no cva tenderness NEURO:Awake/alert, face symmetric, no arm or leg drift is noted Equal 5/5 strength with shoulder abduction, elbow flex/extension, wrist flex/extension in upper extremities and equal hand grips bilaterally Equal 5/5 strength with hip flexion,knee flex/extension, foot dorsi/plantar flexion Cranial nerves 3/4/5/6/04/30/09/11/12 tested and intact Gait normal without ataxia No past pointing Sensation to light touch intact in all extremities EXTREMITIES: pulses normal, full ROM SKIN: warm, color normal PSYCH: no abnormalities of mood noted   ED Results / Procedures / Treatments   Labs (all labs ordered are listed, but only abnormal results are displayed) Labs Reviewed  CBG MONITORING, ED    EKG None  Radiology No results found.  Procedures Procedures    Medications Ordered in ED Medications - No data to display  ED Course/ Medical Decision Making/ A&P  Medical Decision Making  Patient presents for extreme sleepiness for several months.  On review of records it reveals she has been admitted to an outside hospital and had extensive imaging including MRI and EEG.  She also recently was admitted to behavioral health for paranoia.   At this time patient is awake and alert in no acute distress.  She ambulates without difficulty.  She answers all questions appropriately.  She does not appear psychotic at this  time. She reports she has neurology and sleep study follow-up scheduled for September.  Will place urgent referral to neurology here in [place]  As for her nasal discharge, CSF rhinorrhea will be highly unlikely without any recent trauma.  Defer further workup at this time        Final Clinical Impression(s) / ED Diagnoses Final diagnoses:  Hypersomnia    Rx / DC Orders ED Discharge Orders          Ordered    Ambulatory referral to Neurology       Comments: An appointment is requested in approximately: 2 weeks   04/08/23 0531              Zadie Rhine, MD 04/08/23 [phone]

## 2023-04-08 NOTE — ED Notes (Signed)
Pt CBG was 107

## 2023-04-08 NOTE — ED Notes (Signed)
Pt called x 3 no answer, assumed LWBS

## 2023-04-09 LAB — CBG MONITORING, ED: Glucose-Capillary: 107 mg/dL — ABNORMAL HIGH (ref 70–99)

## 2023-05-23 ENCOUNTER — Ambulatory Visit (INDEPENDENT_AMBULATORY_CARE_PROVIDER_SITE_OTHER): Payer: Medicaid Other | Admitting: Neurology

## 2023-05-23 ENCOUNTER — Encounter: Payer: Self-pay | Admitting: Neurology

## 2023-05-23 VITALS — BP 111/77 | HR 59 | Ht 68.0 in | Wt 209.4 lb

## 2023-05-23 DIAGNOSIS — G4719 Other hypersomnia: Secondary | ICD-10-CM

## 2023-05-23 DIAGNOSIS — G47419 Narcolepsy without cataplexy: Secondary | ICD-10-CM | POA: Diagnosis not present

## 2023-05-23 DIAGNOSIS — F411 Generalized anxiety disorder: Secondary | ICD-10-CM | POA: Insufficient documentation

## 2023-05-23 DIAGNOSIS — R002 Palpitations: Secondary | ICD-10-CM | POA: Diagnosis not present

## 2023-05-23 DIAGNOSIS — F430 Acute stress reaction: Secondary | ICD-10-CM

## 2023-05-23 NOTE — Patient Instructions (Signed)
Narcolepsy Narcolepsy is a disorder that causes people to fall asleep suddenly and without control (have sleep attacks) during the daytime. It is a lifelong disorder. Narcolepsy disrupts the sleep cycle at night, which then causes daytime sleepiness. What are the causes? The cause of narcolepsy is not fully understood, but it may be related to: Low levels of hypocretin, a chemical (neurotransmitter) in the brain that controls sleep and wake cycles. Hypocretin imbalance may be caused by: Abnormal genes that are passed from parent to child (inherited). An autoimmune disease in which the body's defense system (immune system) attacks the brain cells that make hypocretin. Infection, tumor, or injury in the area of the brain that controls sleep. Exposure to poisons (toxins), such as heavy metals, pesticides, and secondhand smoke. What are the signs or symptoms? Symptoms of this condition include: Excessive daytime sleepiness. This is the most common symptom and is usually the first symptom you will notice. This may affect your performance at work or school. Sleep attacks. You may fall asleep in the middle of an activity, especially low-energy activities like reading or watching TV. Feeling like you cannot think clearly and having trouble focusing or remembering things. You may also feel depressed. Sudden muscle weakness (cataplexy). When this occurs, your speech may become slurred, or your knees may buckle. Cataplexy is usually triggered by surprise, anger, fear, or laughter. Losing the ability to speak or move (sleep paralysis). This may occur just as you start to fall asleep or wake up. You will be aware of the paralysis. It usually lasts for just a few seconds or minutes. Seeing, hearing, tasting, smelling, or feeling things that are not real (hallucinations). Hallucinations may occur with sleep paralysis. They can happen when you are falling asleep, waking up, or dozing. Trouble staying asleep at  night (insomnia) and restless sleep. How is this diagnosed? This condition may be diagnosed based on: A physical exam to rule out any other problems that may be causing your symptoms. You may be asked to write down your sleeping patterns for several weeks in a sleep diary. This will help your health care provider make a diagnosis. Sleep studies that measure how well your REM sleep is regulated. These tests also measure your heart rate, breathing, movement, and brain waves. These tests include: An overnight sleep study (polysomnogram). A daytime sleep study that is done while you take several naps during the day (multiple sleep latency test, MSLT). This test measures how quickly you fall asleep and how quickly you enter REM sleep. Removal of spinal fluid to measure hypocretin levels. How is this treated? There is no cure for this condition, but treatment can help relieve symptoms. Treatment may include: Lifestyle and sleeping strategies to help you cope with the condition, such as: Exercising regularly. Maintaining a regular sleep schedule. Avoiding caffeine and large meals before bed. Medicines. These may include: Medicines that help keep you awake and alert (stimulants) to fight daytime sleepiness. Medicines that treat depression (antidepressants). These may be used to treat cataplexy. Sodium oxybate. This is a strong medicine to help you relax (sedative) that you may take at night. It can help control daytime sleepiness and cataplexy. Other treatments may include mental health counseling or joining a support group. Follow these instructions at home: Sleeping habits  Get about 8 hours of sleep every night. Go to sleep and get up at about the same time every day. Keep your bedroom dark, quiet, and comfortable. When you feel very tired, take short naps. Schedule naps  so that you take them at about the same time every day. Before bedtime: Avoid bright lights and screens. Relax. Try  activities like reading or taking a warm bath. Activity Get at least 20 minutes of exercise every day. This will help you sleep better at night and reduce daytime sleepiness. Avoid exercising within 3 hours of bedtime. Do not drive or use machinery if you are sleepy. If possible, take a nap before driving. Do not swim or go out on the water without a life jacket. Eating and drinking Do not drink alcohol or caffeinated beverages within 4-5 hours of bedtime. Do not eat a large meal before bedtime. Eat meals at about the same times every day. General instructions  Take over-the-counter and prescription medicines only as told by your health care provider. Keep a sleep diary as told by your health care provider. Tell your employer or teachers that you have narcolepsy. You may be able to adjust your schedule to include time for naps. Do not use any products that contain nicotine or tobacco. These products include cigarettes, chewing tobacco, and vaping devices, such as e-cigarettes. If you need help quitting, ask your health care provider. Where to find more information [employer] [facility] Neurological Disorders and Stroke: [employer] Contact a health care provider if: Your symptoms are not getting better. You have fast or irregular heartbeats (palpitations). You are having a hard time determining what is real and what is not (psychosis). Get help right away if: You hurt yourself during a sleep attack or an attack of cataplexy. You have chest pain. You have trouble breathing. These symptoms may be an emergency. Get help right away. Call 911. Do not wait to see if the symptoms will go away. Do not drive yourself to the hospital. Summary Narcolepsy is a disorder that causes people to fall asleep suddenly and without control during the daytime (sleep attacks). Narcolepsy is a lifelong disorder with no cure. Treatment can help relieve symptoms. Go to sleep and get up at about the same time  every day. Follow instructions about sleep and activities as told by your health care provider. Take over-the-counter and prescription medicines only as told by your health care provider. This information is not intended to replace advice given to you by your health care provider. Make sure you discuss any questions you have with your health care provider. Document Revised: 02/10/2022 Document Reviewed: 02/10/2022 [employer] Patient Education  2024 [employer] Hypersomnia Hypersomnia is a condition in which a person feels very tired during the day even though the person gets plenty of sleep at night. A person with this condition may take naps during the day and may find it very difficult to wake up from sleep. Hypersomnia may affect a person's ability to think, concentrate, drive, or remember things. What are the causes? The cause of this condition may not be known. Possible causes include: Taking certain medicines. Using drugs or alcohol. Sleep disorders, such as narcolepsy and sleep apnea. Injury to the head, brain, or spinal cord. Tumors. Certain medical conditions. These include: Depression. Diabetes. Gastroesophageal reflux disease (GERD). An underactive thyroid gland (hypothyroidism). What are the signs or symptoms? The main symptoms of hypersomnia include: Feeling very tired throughout the day, regardless of how much sleep you got the night before. Having trouble waking up. Others may find it difficult to wake you up when you are sleeping. Sleeping for longer and longer periods at a time. Taking naps throughout the day. Other symptoms may include: Feeling restless,  anxious, or annoyed. Lacking energy. Having trouble with: Remembering. Speaking. Thinking. Loss of appetite. Seeing, hearing, tasting, smelling, or feeling things that are not real (hallucinations). How is this diagnosed? This condition may be diagnosed based on: Your symptoms and medical history. Your  sleeping habits. Your health care provider may ask you to write down your sleeping habits in a daily sleep log, along with any symptoms you have. A series of tests that are done while you sleep (sleep study or polysomnogram). A test that measures how quickly you can fall asleep during the day (daytime nap study or multiple sleep latency test). How is this treated? This condition may be treated by: Following a regular sleep routine. Making lifestyle changes, such as changing your eating habits, getting regular exercise, and avoiding alcohol or caffeinated beverages. Taking medicines to make you more alert (stimulants) during the day. Treating any underlying medical causes of hypersomnia. Follow these instructions at home: Sleep habits Stick to a routine that includes going to bed and waking up at the same times every day and night. Practice a relaxing bedtime routine. This may include reading, meditation, deep breathing, or taking a warm bath before going to sleep. Exercise regularly as told by your health care provider. However, avoid exercising in the hours right before bedtime. Keep your sleep environment at a cooler temperature, darkened, and quiet. Sleep with pillows and a mattress that are comfortable and supportive. Schedule short 20-minute naps for when you feel sleepiest during the day. Talk with your employer or teachers about your hypersomnia. If possible, adjust your schedule so that: You have a regular daytime work schedule. You can take a scheduled nap during the day. You do not have to work or be active at night. Do not eat a heavy meal for a few hours before bedtime. Eat your meals at about the same times every day. Safety  Do not drive or use machinery if you are sleepy. Ask your health care provider if it is safe for you to drive. Wear a life jacket when swimming or spending time near water. General instructions  Take over-the-counter and prescription medicines only as  told by your health care provider. This includes supplements. Avoid drinking alcohol or caffeinated beverages. Keep a sleep log that will help your health care provider manage your condition. This may include information about: What time you go to bed each night. How often you wake up at night. How many hours you sleep at night. How often and for how long you nap during the day. Any observations from others, such as leg movements during sleep, sleep walking, or snoring. Keep all follow-up visits. This is important. Contact a health care provider if: You have new symptoms. Your symptoms get worse. Get help right away if: You have thoughts about hurting yourself or someone else. Get help right away if you feel like you may hurt yourself or others, or have thoughts about taking your own life. Go to your nearest emergency room or: Call 911. Call the National Suicide Prevention Lifeline at 626 441 6756 or 988. This is open 24 hours a day. Text the Crisis Text Line at 8653372451. Summary Hypersomnia refers to a condition in which you feel very tired during the day even though you get plenty of sleep at night. A person with this condition may take naps during the day and may find it very difficult to wake up from sleep. Hypersomnia may affect a person's ability to think, concentrate, drive, or remember things. Treatment may  include a regular sleep routine and making some lifestyle changes. This information is not intended to replace advice given to you by your health care provider. Make sure you discuss any questions you have with your health care provider. Document Revised: 09/19/2021 Document Reviewed: 09/19/2021 [employer] Patient Education  2024 [employer].
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
## 2023-05-23 NOTE — Addendum Note (Signed)
Addended by: Melvyn Novas on: 05/23/2023 11:22 AM   Modules accepted: Orders

## 2023-06-13 ENCOUNTER — Telehealth: Payer: Self-pay | Admitting: Neurology

## 2023-06-13 NOTE — Telephone Encounter (Signed)
NPSG/MSLT [facility] pending

## 2023-06-14 NOTE — Telephone Encounter (Signed)
NPSG/MSLT M[facility] [place]: [phone] (exp. 06/13/23 to 10/23/23)

## 2023-06-28 NOTE — Telephone Encounter (Signed)
I left a voicemail on the patient phone about scheduling her Sleep studies I also informed her that I sent her a mychart message as well.

## 2023-07-19 NOTE — Telephone Encounter (Signed)
I called the patient she did not answer, I left her another voicemail about contacting us to schedule her sleep studies. I left my direct number. Also sent her a [employer].
